# Patient Record
Sex: Male | Born: 1990
Health system: Southern US, Community
[De-identification: ages and names within clinical notes are randomized; demographics above are authoritative.]

## PROBLEM LIST (undated history)

## (undated) DIAGNOSIS — Z8489 Family history of other specified conditions: Secondary | ICD-10-CM

## (undated) DIAGNOSIS — Z87442 Personal history of urinary calculi: Secondary | ICD-10-CM

## (undated) DIAGNOSIS — J189 Pneumonia, unspecified organism: Secondary | ICD-10-CM

## (undated) DIAGNOSIS — N200 Calculus of kidney: Secondary | ICD-10-CM

## (undated) DIAGNOSIS — K219 Gastro-esophageal reflux disease without esophagitis: Secondary | ICD-10-CM

## (undated) HISTORY — DX: Calculus of kidney: N20.0

## (undated) HISTORY — PX: APPENDECTOMY: SHX54

---
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
## 2011-08-30 ENCOUNTER — Emergency Department (INDEPENDENT_AMBULATORY_CARE_PROVIDER_SITE_OTHER)
Admission: EM | Admit: 2011-08-30 | Discharge: 2011-08-30 | Disposition: A | Discharge status: Home / Self Care | Payer: Self-pay | Source: Home / Self Care | Attending: Family Medicine | Admitting: Family Medicine

## 2011-08-30 ENCOUNTER — Emergency Department (INDEPENDENT_AMBULATORY_CARE_PROVIDER_SITE_OTHER): Payer: Medicaid Other

## 2011-08-30 ENCOUNTER — Encounter: Payer: Self-pay | Admitting: Emergency Medicine

## 2011-08-30 DIAGNOSIS — J111 Influenza due to unidentified influenza virus with other respiratory manifestations: Secondary | ICD-10-CM

## 2011-08-30 LAB — POCT RAPID STREP A: Streptococcus, Group A Screen (Direct): NEGATIVE

## 2011-08-30 LAB — POCT URINALYSIS DIP (DEVICE)
Protein, ur: 30 mg/dL — AB
Specific Gravity, Urine: 1.025 (ref 1.005–1.030)
Urobilinogen, UA: 0.2 mg/dL (ref 0.0–1.0)
pH: 5 (ref 5.0–8.0)

## 2011-08-30 MED ORDER — ONDANSETRON HCL 4 MG PO TABS: 4.0000 mg | ORAL_TABLET | Freq: Four times a day (QID) | ORAL | Status: AC

## 2011-08-30 MED ORDER — ACETAMINOPHEN 325 MG PO TABS
ORAL_TABLET | ORAL | Status: AC
  Filled 2011-08-30: qty 1

## 2011-08-30 MED ORDER — ACETAMINOPHEN 325 MG PO TABS
650.0000 mg | ORAL_TABLET | Freq: Four times a day (QID) | ORAL | Status: DC | PRN
  Administered 2011-08-30: 650 mg via ORAL

## 2011-08-30 MED ORDER — ONDANSETRON 4 MG PO TBDP
ORAL_TABLET | ORAL | Status: AC
  Filled 2011-08-30: qty 1

## 2011-08-30 MED ORDER — ONDANSETRON 4 MG PO TBDP
4.0000 mg | ORAL_TABLET | Freq: Once | ORAL | Status: AC
  Administered 2011-08-30: 4 mg via ORAL

## 2011-08-30 NOTE — ED Provider Notes (Signed)
History     CSN: [id number] Arrival date & time: 08/30/2011 11:37 AM   First MD Initiated Contact with Patient 08/30/11 1058      Chief Complaint  Patient presents with  . Fever    fever and vomiting initially--2 days ago.  since then stomach pain, sore throat followed vomiting, low back pain and general aches, reporting fevers of 104 at home.      (Consider location/radiation/quality/duration/timing/severity/associated sxs/prior treatment) Patient is a 20 y.o. male presenting with fever. The history is provided by the patient.  Fever Primary symptoms of the febrile illness include fever, fatigue, cough, abdominal pain and vomiting. Primary symptoms do not include diarrhea, dysuria or rash. The current episode started 3 to 5 days ago. This is a new problem. The problem has not changed since onset.   History reviewed. No pertinent past medical history.  History reviewed. No pertinent past surgical history.  History reviewed. No pertinent family history.  History  Substance Use Topics  . Smoking status: Never Smoker   . Smokeless tobacco: Not on file  . Alcohol Use: No      Review of Systems  Constitutional: Positive for fever, activity change, appetite change and fatigue.  HENT: Positive for congestion, sore throat, rhinorrhea and postnasal drip.   Respiratory: Positive for cough.   Gastrointestinal: Positive for vomiting and abdominal pain. Negative for diarrhea.  Genitourinary: Negative for dysuria.  Musculoskeletal: Positive for back pain.  Skin: Negative for rash.    Allergies  Review of patient's allergies indicates no known allergies.  Home Medications   Current Outpatient Rx  Name Route Sig Dispense Refill  . ONDANSETRON HCL 4 MG PO TABS Oral Take 1 tablet (4 mg total) by mouth every 6 (six) hours. 6 tablet 0    BP 100/64  Pulse 115  Temp(Src) 103 F (39.4 C) (Oral)  Resp 18  SpO2 98%  Physical Exam  Nursing note and vitals  reviewed. Constitutional: He appears well-developed and well-nourished.  HENT:  Head: Normocephalic.  Right Ear: External ear normal.  Left Ear: External ear normal.  Mouth/Throat: Posterior oropharyngeal erythema present. No oropharyngeal exudate.  Cardiovascular: Normal rate, normal heart sounds and intact distal pulses.   Pulmonary/Chest: Effort normal and breath sounds normal.  Abdominal: Soft. Normal appearance and bowel sounds are normal. There is tenderness in the suprapubic area. There is rebound. There is no guarding and no CVA tenderness.  Skin: Skin is warm and dry.    ED Course  Procedures (including critical care time)  Labs Reviewed  POCT URINALYSIS DIP (DEVICE) - Abnormal; Notable for the following:    Bilirubin Urine SMALL (*)    Ketones, ur TRACE (*)    Protein, ur 30 (*)    All other components within normal limits  POCT RAPID STREP A ([facility] CARE ONLY)  POCT URINALYSIS DIPSTICK   Dg Chest 2 View  08/30/2011  *RADIOLOGY REPORT*  Clinical Data: Fever and cough  CHEST - 2 VIEW  Comparison: 10/12/2010  Findings: Cardiomediastinal silhouette is within normal limits. Lungs are clear.  No pneumothorax and no pleural effusion. Hyperaeration.  IMPRESSION: No active cardiopulmonary disease.  Original Report Authenticated By: Donavan Burnet, M.D.     1. Influenza       MDM  X-rays reviewed and report per radiologist.,  Strep neg and u/a neg.         Barkley Bruns, MD 08/30/11 1247

## 2011-08-30 NOTE — ED Notes (Signed)
See above

## 2015-04-03 ENCOUNTER — Emergency Department (HOSPITAL_BASED_OUTPATIENT_CLINIC_OR_DEPARTMENT_OTHER)
Admission: EM | Admit: 2015-04-03 | Discharge: 2015-04-03 | Disposition: A | Discharge status: Home / Self Care | Payer: Self-pay | Attending: Emergency Medicine | Admitting: Emergency Medicine

## 2015-04-03 ENCOUNTER — Emergency Department (HOSPITAL_BASED_OUTPATIENT_CLINIC_OR_DEPARTMENT_OTHER): Payer: Self-pay

## 2015-04-03 ENCOUNTER — Encounter (HOSPITAL_BASED_OUTPATIENT_CLINIC_OR_DEPARTMENT_OTHER): Payer: Self-pay

## 2015-04-03 DIAGNOSIS — Y9367 Activity, basketball: Secondary | ICD-10-CM | POA: Insufficient documentation

## 2015-04-03 DIAGNOSIS — Y92310 Basketball court as the place of occurrence of the external cause: Secondary | ICD-10-CM | POA: Insufficient documentation

## 2015-04-03 DIAGNOSIS — Y998 Other external cause status: Secondary | ICD-10-CM | POA: Insufficient documentation

## 2015-04-03 DIAGNOSIS — W51XXXA Accidental striking against or bumped into by another person, initial encounter: Secondary | ICD-10-CM | POA: Insufficient documentation

## 2015-04-03 DIAGNOSIS — S0083XA Contusion of other part of head, initial encounter: Secondary | ICD-10-CM | POA: Insufficient documentation

## 2015-04-03 DIAGNOSIS — S060X1A Concussion with loss of consciousness of 30 minutes or less, initial encounter: Secondary | ICD-10-CM | POA: Insufficient documentation

## 2015-04-03 NOTE — Discharge Instructions (Signed)
Tylenol 1000 mg rotated with Motrin 600 mg every hour hours as needed for headache.  Do not participate in contact sports until you are symptom-free for 1 week.  Return to the ER if symptoms significantly worsen or change.   Concussion A concussion, or closed-head injury, is a brain injury caused by a direct blow to the head or by a quick and sudden movement (jolt) of the head or neck. Concussions are usually not life-threatening. Even so, the effects of a concussion can be serious. If you have had a concussion before, you are more likely to experience concussion-like symptoms after a direct blow to the head.  CAUSES  Direct blow to the head, such as from running into another player during a soccer game, being hit in a fight, or hitting your head on a hard surface.  A jolt of the head or neck that causes the brain to move back and forth inside the skull, such as in a car crash. SIGNS AND SYMPTOMS The signs of a concussion can be hard to notice. Early on, they may be missed by you, family members, and health care providers. You may look fine but act or feel differently. Symptoms are usually temporary, but they may last for days, weeks, or even longer. Some symptoms may appear right away while others may not show up for hours or days. Every head injury is different. Symptoms include:  Mild to moderate headaches that will not go away.  A feeling of pressure inside your head.  Having more trouble than usual:  Learning or remembering things you have heard.  Answering questions.  Paying attention or concentrating.  Organizing daily tasks.  Making decisions and solving problems.  Slowness in thinking, acting or reacting, speaking, or reading.  Getting lost or being easily confused.  Feeling tired all the time or lacking energy (fatigued).  Feeling drowsy.  Sleep disturbances.  Sleeping more than usual.  Sleeping less than usual.  Trouble falling asleep.  Trouble sleeping  (insomnia).  Loss of balance or feeling lightheaded or dizzy.  Nausea or vomiting.  Numbness or tingling.  Increased sensitivity to:  Sounds.  Lights.  Distractions.  Vision problems or eyes that tire easily.  Diminished sense of taste or smell.  Ringing in the ears.  Mood changes such as feeling sad or anxious.  Becoming easily irritated or angry for little or no reason.  Lack of motivation.  Seeing or hearing things other people do not see or hear (hallucinations). DIAGNOSIS Your health care provider can usually diagnose a concussion based on a description of your injury and symptoms. He or she will ask whether you passed out (lost consciousness) and whether you are having trouble remembering events that happened right before and during your injury. Your evaluation might include:  A brain scan to look for signs of injury to the brain. Even if the test shows no injury, you may still have a concussion.  Blood tests to be sure other problems are not present. TREATMENT  Concussions are usually treated in an emergency department, in urgent care, or at a clinic. You may need to stay in the hospital overnight for further treatment.  Tell your health care provider if you are taking any medicines, including prescription medicines, over-the-counter medicines, and natural remedies. Some medicines, such as blood thinners (anticoagulants) and aspirin, may increase the chance of complications. Also tell your health care provider whether you have had alcohol or are taking illegal drugs. This information may affect treatment.  Your health care provider will send you home with important instructions to follow.  How fast you will recover from a concussion depends on many factors. These factors include how severe your concussion is, what part of your brain was injured, your age, and how healthy you were before the concussion.  Most people with mild injuries recover fully. Recovery can  take time. In general, recovery is slower in older persons. Also, persons who have had a concussion in the past or have other medical problems may find that it takes longer to recover from their current injury. HOME CARE INSTRUCTIONS General Instructions  Carefully follow the directions your health care provider gave you.  Only take over-the-counter or prescription medicines for pain, discomfort, or fever as directed by your health care provider.  Take only those medicines that your health care provider has approved.  Do not drink alcohol until your health care provider says you are well enough to do so. Alcohol and certain other drugs may slow your recovery and can put you at risk of further injury.  If it is harder than usual to remember things, write them down.  If you are easily distracted, try to do one thing at a time. For example, do not try to watch TV while fixing dinner.  Talk with family members or close friends when making important decisions.  Keep all follow-up appointments. Repeated evaluation of your symptoms is recommended for your recovery.  Watch your symptoms and tell others to do the same. Complications sometimes occur after a concussion. Older adults with a brain injury may have a higher risk of serious complications, such as a blood clot on the brain.  Tell your teachers, school nurse, school counselor, coach, athletic trainer, or work Freight forwarder about your injury, symptoms, and restrictions. Tell them about what you can or cannot do. They should watch for:  Increased problems with attention or concentration.  Increased difficulty remembering or learning new information.  Increased time needed to complete tasks or assignments.  Increased irritability or decreased ability to cope with stress.  Increased symptoms.  Rest. Rest helps the brain to heal. Make sure you:  Get plenty of sleep at night. Avoid staying up late at night.  Keep the same bedtime hours on  weekends and weekdays.  Rest during the day. Take daytime naps or rest breaks when you feel tired.  Limit activities that require a lot of thought or concentration. These include:  Doing homework or job-related work.  Watching TV.  Working on the computer.  Avoid any situation where there is potential for another head injury (football, hockey, soccer, basketball, martial arts, downhill snow sports and horseback riding). Your condition will get worse every time you experience a concussion. You should avoid these activities until you are evaluated by the appropriate follow-up health care providers. Returning To Your Regular Activities You will need to return to your normal activities slowly, not all at once. You must give your body and brain enough time for recovery.  Do not return to sports or other athletic activities until your health care provider tells you it is safe to do so.  Ask your health care provider when you can drive, ride a bicycle, or operate heavy machinery. Your ability to react may be slower after a brain injury. Never do these activities if you are dizzy.  Ask your health care provider about when you can return to work or school. Preventing Another Concussion It is very important to avoid another brain injury,  especially before you have recovered. In rare cases, another injury can lead to permanent brain damage, brain swelling, or death. The risk of this is greatest during the first 7-10 days after a head injury. Avoid injuries by:  Wearing a seat belt when riding in a car.  Drinking alcohol only in moderation.  Wearing a helmet when biking, skiing, skateboarding, skating, or doing similar activities.  Avoiding activities that could lead to a second concussion, such as contact or recreational sports, until your health care provider says it is okay.  Taking safety measures in your home.  Remove clutter and tripping hazards from floors and stairways.  Use grab bars  in bathrooms and handrails by stairs.  Place non-slip mats on floors and in bathtubs.  Improve lighting in dim areas. SEEK MEDICAL CARE IF:  You have increased problems paying attention or concentrating.  You have increased difficulty remembering or learning new information.  You need more time to complete tasks or assignments than before.  You have increased irritability or decreased ability to cope with stress.  You have more symptoms than before. Seek medical care if you have any of the following symptoms for more than 2 weeks after your injury:  Lasting (chronic) headaches.  Dizziness or balance problems.  Nausea.  Vision problems.  Increased sensitivity to noise or light.  Depression or mood swings.  Anxiety or irritability.  Memory problems.  Difficulty concentrating or paying attention.  Sleep problems.  Feeling tired all the time. SEEK IMMEDIATE MEDICAL CARE IF:  You have severe or worsening headaches. These may be a sign of a blood clot in the brain.  You have weakness (even if only in one hand, leg, or part of the face).  You have numbness.  You have decreased coordination.  You vomit repeatedly.  You have increased sleepiness.  One pupil is larger than the other.  You have convulsions.  You have slurred speech.  You have increased confusion. This may be a sign of a blood clot in the brain.  You have increased restlessness, agitation, or irritability.  You are unable to recognize people or places.  You have neck pain.  It is difficult to wake you up.  You have unusual behavior changes.  You lose consciousness. MAKE SURE YOU:  Understand these instructions.  Will watch your condition.  Will get help right away if you are not doing well or get worse. Document Released: 11/29/2003 Document Revised: 09/13/2013 Document Reviewed: 03/31/2013 [facility] Patient Information 2015 [place], [place]. This information is not intended to  replace advice given to you by your health care provider. Make sure you discuss any questions you have with your health care provider.

## 2015-04-03 NOTE — ED Notes (Signed)
Playing basketball last night-LOC when elbowed left eye area-c/o pain, dizzy, vomiting-bruising noted to lateral left eye area-NAD

## 2015-04-03 NOTE — ED Provider Notes (Signed)
CSN: [id number]     Arrival date & time 04/03/15  2120 History   This chart was scribed for Geoffery Lyonsouglas Kacey Dysert, MD by Abel PrestoKara Demonbreun, ED Scribe. This patient was seen in room MH07/MH07 and the patient's care was started at 10:02 PM.    Chief Complaint  Patient presents with  . Head Injury     The history is provided by the patient. No language interpreter was used.   HPI Comments: Peyton Najjarnthony W Sand is a 24 y.o. male with no significant PMHx who presents to the Emergency Department complaining of head injury around 8:30 PM last night. Pt states he was playing basketball and was elbowed near left eye at onset and fell onto back. Noted bruising. Pt notes associated LOC for several seconds, constant headache, weakness, mild blurring of left eye this morning which has resolved, and facial pain radiating into neck and back. He states weakness is worse with standing and reports he cannot stand for long. Pt denies any other complaints currently.   History reviewed. No pertinent past medical history. History reviewed. No pertinent past surgical history. No family history on file. History  Substance Use Topics  . Smoking status: Never Smoker   . Smokeless tobacco: Not on file  . Alcohol Use: No    Review of Systems A complete 10 system review of systems was obtained and all systems are negative except as noted in the HPI and PMH.     Allergies  Review of patient's allergies indicates no known allergies.  Home Medications   Prior to Admission medications   Not on File   BP 124/72 mmHg  Pulse 73  Temp(Src) 98.8 F (37.1 C) (Oral)  Resp 16  Ht 5\' 8"  (1.727 m)  Wt 128 lb (58.06 kg)  BMI 19.47 kg/m2  SpO2 100% Physical Exam  Constitutional: He is oriented to person, place, and time. He appears well-developed and well-nourished.  HENT:  Head: Normocephalic.  Eyes: EOM are normal. Pupils are equal, round, and reactive to light.  There is an ecchymotic area to the soft tissues lateral to the  left eye. The is no diplopia with upward gaze. No evidence of orbital muscle entrapment  Neck: Normal range of motion.  Pulmonary/Chest: Effort normal.  Abdominal: He exhibits no distension.  Musculoskeletal: Normal range of motion.  Neurological: He is alert and oriented to person, place, and time. No cranial nerve deficit. He exhibits normal muscle tone. Coordination normal.  Psychiatric: He has a normal mood and affect.  Nursing note and vitals reviewed.   ED Course  Procedures (including critical care time) DIAGNOSTIC STUDIES: Oxygen Saturation is 100% on room air, normal by my interpretation.    COORDINATION OF CARE: 10:06 PM Discussed treatment plan with patient at beside, the patient agrees with the plan and has no further questions at this time.   Marland Kitchen.scribeeed  Labs Review Labs Reviewed - No data to display  Imaging Review No results found.   EKG Interpretation None      MDM   Final diagnoses:  None    Patient is a [age] year old male who presents after being elbowed in the left eye while playing basketball. He reports a 1 minute loss of consciousness and swelling and bruising the next to his left eye. His neurologic exam is nonfocal and head CT is negative. I see no clinical evidence for blowout fracture and none appear on the CT scan. He will be discharged to home with instructions to return as needed. I have  also advised him not to return any contact sports until he is been symptom-free for at least one week.   I personally performed the services described in this documentation, which was scribed in my presence. The recorded information has been reviewed and is accurate.      Geoffery Lyons, MD 04/03/15 2248

## 2015-04-07 ENCOUNTER — Emergency Department (HOSPITAL_BASED_OUTPATIENT_CLINIC_OR_DEPARTMENT_OTHER)
Admission: EM | Admit: 2015-04-07 | Discharge: 2015-04-07 | Disposition: A | Discharge status: Home / Self Care | Payer: Self-pay | Attending: Emergency Medicine | Admitting: Emergency Medicine

## 2015-04-07 ENCOUNTER — Encounter (HOSPITAL_BASED_OUTPATIENT_CLINIC_OR_DEPARTMENT_OTHER): Payer: Self-pay | Admitting: *Deleted

## 2015-04-07 DIAGNOSIS — G44309 Post-traumatic headache, unspecified, not intractable: Secondary | ICD-10-CM | POA: Insufficient documentation

## 2015-04-07 DIAGNOSIS — F0781 Postconcussional syndrome: Secondary | ICD-10-CM | POA: Insufficient documentation

## 2015-04-07 MED ORDER — FLUORESCEIN SODIUM 1 MG OP STRP
1.0000 | ORAL_STRIP | Freq: Once | OPHTHALMIC | Status: AC
  Administered 2015-04-07: 1 via OPHTHALMIC
  Filled 2015-04-07: qty 1

## 2015-04-07 MED ORDER — CLINDAMYCIN PALMITATE HCL 75 MG/5ML PO SOLR
8.0000 mg/kg | Freq: Once | ORAL | Status: DC
  Filled 2015-04-07: qty 31

## 2015-04-07 MED ORDER — SODIUM CHLORIDE 0.9 % IV BOLUS (SEPSIS)
1000.0000 mL | Freq: Once | INTRAVENOUS | Status: AC
  Administered 2015-04-07: 1000 mL via INTRAVENOUS

## 2015-04-07 MED ORDER — TETRACAINE HCL 0.5 % OP SOLN
2.0000 [drp] | Freq: Once | OPHTHALMIC | Status: AC
  Administered 2015-04-07: 2 [drp] via OPHTHALMIC

## 2015-04-07 NOTE — ED Notes (Addendum)
Elbowed in eye Monday night, mentions +LOC, seen here Tuesday, dx'd with concussion, sx continue, pt lists: HA, dizzy, legs give out, episodes of can't breathe at night, light sensitive, have to leave work. Pt alert, NAD, calm, interactive, speech clear, steady gait. Urine sample saved in triage.

## 2015-04-07 NOTE — ED Provider Notes (Signed)
CSN: [id number]     Arrival date & time 04/07/15  1859 History   First MD Initiated Contact with Patient 04/07/15 1915     Chief Complaint  Patient presents with  . Headache     (Consider location/radiation/quality/duration/timing/severity/associated sxs/prior Treatment) HPI   Blood pressure 119/75, pulse 54, temperature 98.1 F (36.7 C), temperature source Oral, resp. rate 18, height 5\' 8"  (1.727 m), weight 128 lb (58.06 kg), SpO2 100 %.  Kevin Benton is a 24 y.o. male who was elbowed in the left eye while playing basketball several days ago. Patient fell backwards hitting the occipital part of his head on the ground, there was a brief loss of consciousness. Patient was seen and evaluated at the time of the incident, his head CT at that time was negative. There is no evidence of blowout fracture ocular entrapment. Patient states that he continues to have symptoms of lightheaded sensation, states that he feels weak and like his legs are buckling, he also states that he feels like he is leaning to the right when he is walking however he has not fallen. States he has difficulty concentrating and wakes up in the middle of the night feeling like it's difficult for him to breathe. Patient denies headache, change in vision, vomiting, dysarthria, ataxia, chest pain, shortness of breath.  History reviewed. No pertinent past medical history. History reviewed. No pertinent past surgical history. No family history on file. History  Substance Use Topics  . Smoking status: Never Smoker   . Smokeless tobacco: Not on file  . Alcohol Use: No    Review of Systems  10 systems reviewed and found to be negative, except as noted in the HPI.   Allergies  Review of patient's allergies indicates no known allergies.  Home Medications   Prior to Admission medications   Not on File   BP 119/75 mmHg  Pulse 54  Temp(Src) 98.1 F (36.7 C) (Oral)  Resp 18  Ht 5\' 8"  (1.727 m)  Wt 128 lb (58.06 kg)   BMI 19.47 kg/m2  SpO2 100% Physical Exam  Constitutional: He is oriented to person, place, and time. He appears well-developed and well-nourished. No distress.  HENT:  Head: Normocephalic.    Mouth/Throat: Oropharynx is clear and moist.  Extraocular movement intact without pain or diplopia. No abnormal uptake on fluorescein stain.  Visual Acuity - Bilateral Near: 20/20 ; R Near: 20/20 ; L Near: 20/25  Eyes: Conjunctivae and EOM are normal. Pupils are equal, round, and reactive to light.  Cardiovascular: Normal rate, regular rhythm and intact distal pulses.   Pulmonary/Chest: Effort normal. No stridor.  Musculoskeletal: Normal range of motion.  Neurological: He is alert and oriented to person, place, and time.  Follows commands, Clear, goal oriented speech, Strength is 5 out of 5x4 extremities, patient ambulates with a coordinated in nonantalgic gait. Sensation is grossly intact.   Psychiatric: He has a normal mood and affect.  Nursing note and vitals reviewed.   ED Course  Procedures (including critical care time) Labs Review Labs Reviewed - No data to display  Imaging Review No results found.   EKG Interpretation None      MDM   Final diagnoses:  Post concussion syndrome    Filed Vitals:   04/07/15 1903  BP: 119/75  Pulse: 54  Temp: 98.1 F (36.7 C)  TempSrc: Oral  Resp: 18  Height: 5\' 8"  (1.727 m)  Weight: 128 lb (58.06 kg)  SpO2: 100%    Medications  tetracaine (PONTOCAINE) 0.5 % ophthalmic solution 2 drop (not administered)  fluorescein ophthalmic strip 1 strip (not administered)  sodium chloride 0.9 % bolus 1,000 mL (0 mLs Intravenous Stopped 04/07/15 2008)    Kevin Benton is a pleasant 24 y.o. male presenting with a constellation of symptoms consistent with a postconcussive syndrome. Patient had a mild head trauma with fleeting LOC several days ago. He had a negative head CT at that time. Neuro exam today is nonfocal. Normal visual acuity and  fluorescein stain of the eye is also normal. Patient is counseled to avoid any activity that might result in secondary head impact. I'll advise him that he will need to follow with neurology or establish primary care for management. We've had a discussion of what to expect with postconcussive syndrome and patient verbalizes his understanding. Work note as Barrister's clerk.  Evaluation does not show pathology that would require ongoing emergent intervention or inpatient treatment. Pt is hemodynamically stable and mentating appropriately. Discussed findings and plan with patient/guardian, who agrees with care plan. All questions answered. Return precautions discussed and outpatient follow up given.        Joni Reining Hadi Dubin, PA-C 04/07/15 2536  Rolan Bucco, MD 04/08/15 [phone]

## 2015-04-07 NOTE — Discharge Instructions (Signed)
Do not participate in any sports or any activities that could result in head trauma until you are cleared by your pediatrician,  primary care physician or neurologist.   Do not hesitate to return to the emergency room for any new, worsening or concerning symptoms.  Please obtain primary care using resource guide below. Let them know that you were seen in the emergency room and that they will need to obtain records for further outpatient management.    Post-Concussion Syndrome Post-concussion syndrome means you have problems after a head injury. The problems can last for weeks or months. The problems usually go away on their own over time. HOME CARE   Only take medicines as told by your doctor. Do not take aspirin.  Sleep with your head raised (elevated) to help with headaches.  Avoid activities that can cause another head injury.  Do not play contact sports like football, hockey, soccer or basketball. Do not do other risky activities like downhill skiing or martial arts, or ride horses until your doctor says it is OK.  Keep all doctor visits as told. GET HELP RIGHT AWAY IF:  You feel confused or very sleepy.  You cannot wake the injured person.  You feel sick to your stomach (nauseous) or keep throwing up (vomiting).  You feel like you are moving when you are not (vertigo).  You notice the injured person's eyes moving back and forth very fast.  You start shaking (convulsing) or pass out (faint).  You have very bad headaches that do not get better with medicine.  You cannot use your arms or legs normally.  The black centers of your eyes (pupils) change size.  You have clear or bloody fluid coming from your nose or ears.  Your problems get worse, not better. MAKE SURE YOU:  Understand these instructions.  Will watch your condition.  Will get help right away if you are not doing well or get worse. Document Released: 10/16/2004 Document Revised: 06/29/2013 Document  Reviewed: 12/14/2013 [facility] Patient Information 2015 [employer] [place]. This information is not intended to replace advice given to you by your health care provider. Make sure you discuss any questions you have with your health care provider.  Emergency Department Resource Guide 1) Find a Doctor and Pay Out of Pocket Although you won't have to find out who is covered by your insurance plan, it is a good idea to ask around and get recommendations. You will then need to call the office and see if the doctor you have chosen will accept you as a new patient and what types of options they offer for patients who are self-pay. Some doctors offer discounts or will set up payment plans for their patients who do not have insurance, but you will need to ask so you aren't surprised when you get to your appointment.  2) Contact Your Local Health Department Not all health departments have doctors that can see patients for sick visits, but many do, so it is worth a call to see if yours does. If you don't know where your local health department is, you can check in your phone book. The CDC also has a tool to help you locate your state's health department, and many state websites also have listings of all of their local health departments.  3) Find a Walk-in Clinic If your illness is not likely to be very severe or complicated, you may want to try a walk in clinic. These are popping up all over the country  in pharmacies, drugstores, and shopping centers. They're usually staffed by nurse practitioners or physician assistants that have been trained to treat common illnesses and complaints. They're usually fairly quick and inexpensive. However, if you have serious medical issues or chronic medical problems, these are probably not your best option.  No Primary Care Doctor: - Call Health Connect at  [phone] - they can help you locate a primary care doctor that  accepts your insurance, provides certain services,  etc. - Physician Referral Service- [phone]  Chronic Pain Problems: Organization         Address  Phone   Notes  [facility]  [phone] Patients need to be referred by their primary care doctor.   Medication Assistance: Organization         Address  Phone   Notes  [facility] [address] [place] [place], [place] [address] [phone] --Must be a resident of [facility] -- Must have NO insurance coverage whatsoever (no Medicaid/ Medicare, etc.) -- The pt. MUST have a primary care doctor that directs their care regularly and follows them in the community   [facility]  [phone]   [employer]  [phone]    Agencies that provide inexpensive medical care: Organization         Address  Phone   Notes  [facility]  [phone]   [facility]    [phone]   [facility] [address] [place], [place] [address] [phone]   [facility] [place] [facility] [place] [address] [place], [place] [phone]   [facility]anned Parenthood    [phone]   [facility]    [phone]   [facility]  [address], [place] Phone:  [phone], Fax:  [phone] Hours of Operation:  9 am - 6 pm, M-F.  Also accepts Medicaid/Medicare and self-pay.  [facility]  [address], [place] Phone: [phone], Fax: [phone]. Hours of Operation:  8:30 am - 5:30 pm, M-F.  Also accepts Medicaid and self-pay.  [facility] [place] [address], [place] Phone: [phone]   [facility] [address] Natasha Bence [place], [place] [phone], Ext. [phone] Mondays & Thursdays: 7-9 AM.  First 15 patients are seen on a first come, first serve basis.    Medicaid-accepting [facility] Providers:  Organization         Address  Phone   Notes  [facility] [address], [place] [phone] Also accepts self-pay patients.  [facility] [address] Laurell Josephs [place], [place]  [phone]   [facility] [address], [place] [phone]   [facility] [address], [place] [phone]   Renaye Rakers [address], [place]   [phone] Only accepts [place] Access [place] patients after they have their name applied to their card.   Self-Pay (no insurance) in [facility]:  Organization         Address  Phone   Notes  Sickle Cell Patients, [facility] [address] [place], [place] [phone]   [facility] [address] [place], [place] [phone]   [facility]  [address], [place] [phone]   [facility]/Dr. Osei-Bonsu  [address] [place] or [address], [place] [phone] Phone number for both [place] and [place] locations is the same.  [facility] [address] [place] [phone]   [facility] [address], [place] or [address] [phone]   [facility] [address], [place] [phone], phone; [phone], fax Sees patients 1st and 3rd Saturday of every month.  Must not qualify for public or private insurance (i.e. Medicaid, Medicare, [place] Health Choice, Veterans' Benefits)  Household income should be no more than 200% of the poverty level The clinic cannot treat you if you are pregnant or think you are pregnant  Sexually transmitted diseases are not treated at the clinic.    Dental Care: Organization         Address  Phone  Notes  [facility] Department of [facility] [address] [place], [place] [phone] Accepts children up to  age 29 who are enrolled in [place] or [place] Health Choice; pregnant women with a Medicaid card; and children who have applied for Medicaid or [place] Health Choice, but were declined, whose parents can pay a reduced fee at time of service.  [facility] of [facility]  [address], [place] [phone] Accepts children up to age 44 who are enrolled in [place] or [place] Health Choice; pregnant women with a Medicaid card; and children who have applied for Medicaid or [place] Health Choice, but were declined, whose parents can pay a reduced fee at time of service.  [facility]  [address] [place], [place] [phone] Patients are seen by appointment only. Walk-ins are not accepted. [facility] will see patients 7 years of age and older. Monday - Tuesday (8am-5pm) Most Wednesdays (8:30-5pm) $30 per visit, cash only  [facility]  [address], [facility] [phone] Patients are seen by appointment only. Walk-ins are not accepted. [facility] will see patients 22 years of age and older. One Wednesday Evening (Monthly: Volunteer Based).  $30 per visit, cash only  [employer]  [phone] for adults; Children under age 72, call Graduate Pediatric Dentistry at [phone]. Children aged 59-14, please call [phone] to request a pediatric application.  Dental services are provided in all areas of dental care including fillings, crowns and bridges, complete and partial dentures, implants, gum treatment, root canals, and extractions. Preventive care is also provided. Treatment is provided to both adults and children. Patients are selected via a lottery and there is often a waiting list.   [facility] [address] [place]  [phone] [email or web address]   [facility] [address] [place], [place] [phone], Ext. [phone] Second and Fourth Thursday of  each month, opens at 6:30 AM; Clinic ends at 9 AM.  Patients are seen on a first-come first-served basis, and a limited number are seen during each clinic.   [facility]  [address] Ether Griffins [place], [place] [phone]   Eligibility Requirements You must have lived in [place], [place], or [place] counties for at least the last three months.   You cannot be eligible for state or federal sponsored [place], including [employer], [place], or [employer].   You generally  cannot be eligible for healthcare insurance through your employer.    How to apply: Eligibility screenings are held every Tuesday and Wednesday afternoon from 1:00 pm until 4:00 pm. You do not need an appointment for the interview!  [facility] [address], [place], [place] [phone]   [facility] Health Department  [phone]   [facility] Health Department  [phone]   [facility] Health Department  [phone]    Behavioral Health Resources in the Community: Intensive Outpatient Programs Organization         Address  Phone  Notes  [facility] Services [address], [place], [place] [phone]   [facility] [address] [place], [place] [phone]   [facility] [address] [place], [place]  [phone]   [facility] Mental Health [address]  [place], [place] [phone] or [phone]   Substance Abuse Resources Organization         Address  Phone  Notes  [facility] Drug Services  [phone]   [facility]  [phone]   [facility] [place]  [phone]   Floydene Flock  [phone]   Residential & Outpatient Substance Abuse Program  [phone]   Psychological Services Organization         Address  Phone  Notes  [facility]  [phone]   [facility]  [phone]   [facility] [address],  [place] [phone] or [phone]    Mobile Crisis Teams Organization         Address  Phone  Notes  [facility], Mobile Crisis Care Unit  [phone]   [facility]  [address] [place], [place] [phone]   Doristine Locks [address] [place] [phone]    Self-Help/Support Groups Organization         Address  Phone             Notes  [facility] [place] - variety of support groups  336- I7437963 Call for more information  Narcotics Anonymous (NA), [facility] [address], [employer] [place]  2 meetings at this location   Statistician         Address  Phone  Notes  [facility] [address],    [place]  [phone]   [facility]  [address] [place] [address], [place], [place] [phone]   [facility] [address] [place], [place] [phone] Admissions: 8am-3pm M-F  [facility] [address]    [place], [place] [phone]   [facility] [address] [place], [place], [place] [phone]   [facility] [address]  [place], [place] [phone]   [facility] - Intensive Outpatient [address] Laurell Josephs [address], [place], [place] [phone]   [facility] [address] [place]  [place], [place] [phone] or [phone]   [facility] ([facility]) [address], [place], [place] [phone] Accepts Medicaid  [facility] [address]  [place] [phone] Substance Abuse/Addiction Treatment   [facility] Organization         Address  Phone  Notes  [facility]  [phone]   Angie Fava, PhD [address] Skeeters [place], [place]   [phone] or 3134119701   [facility]   [address]  [address] [place], [place] [phone]     [facility] [address] [place], [place] [phone] Insurance/Medicaid/sponsorship through [facility] and Families [address]                                    [place], [place] [phone] Therapy/tele-psych/case  [facility] [address]   [place], [place] [phone]    Dr. Lolly Mustache  [phone]   [facility]  [employer] [facility] Dept. 1) [address] [place] 2) [address], [place] 3)  [address], [place] [phone]  [phone]   [facility] Child Abuse Hotline 212-629-1137 or [phone] (After Hours)

## 2015-07-23 ENCOUNTER — Encounter (HOSPITAL_BASED_OUTPATIENT_CLINIC_OR_DEPARTMENT_OTHER): Payer: Self-pay | Admitting: *Deleted

## 2015-07-23 ENCOUNTER — Emergency Department (HOSPITAL_BASED_OUTPATIENT_CLINIC_OR_DEPARTMENT_OTHER): Payer: Self-pay

## 2015-07-23 ENCOUNTER — Emergency Department (HOSPITAL_BASED_OUTPATIENT_CLINIC_OR_DEPARTMENT_OTHER)
Admission: EM | Admit: 2015-07-23 | Discharge: 2015-07-23 | Disposition: A | Discharge status: Home / Self Care | Payer: Self-pay | Attending: Emergency Medicine | Admitting: Emergency Medicine

## 2015-07-23 DIAGNOSIS — S93501A Unspecified sprain of right great toe, initial encounter: Secondary | ICD-10-CM | POA: Insufficient documentation

## 2015-07-23 DIAGNOSIS — Y92321 Football field as the place of occurrence of the external cause: Secondary | ICD-10-CM | POA: Insufficient documentation

## 2015-07-23 DIAGNOSIS — Y9361 Activity, american tackle football: Secondary | ICD-10-CM | POA: Insufficient documentation

## 2015-07-23 DIAGNOSIS — S90111A Contusion of right great toe without damage to nail, initial encounter: Secondary | ICD-10-CM | POA: Insufficient documentation

## 2015-07-23 DIAGNOSIS — Y998 Other external cause status: Secondary | ICD-10-CM | POA: Insufficient documentation

## 2015-07-23 DIAGNOSIS — W228XXA Striking against or struck by other objects, initial encounter: Secondary | ICD-10-CM | POA: Insufficient documentation

## 2015-07-23 DIAGNOSIS — S90121A Contusion of right lesser toe(s) without damage to nail, initial encounter: Secondary | ICD-10-CM | POA: Insufficient documentation

## 2015-07-23 DIAGNOSIS — S93509A Unspecified sprain of unspecified toe(s), initial encounter: Secondary | ICD-10-CM

## 2015-07-23 NOTE — ED Notes (Signed)
Pt reports he was playing football on Saturday and right foot twisted in hole- pain and bruising noted- pt has been ambulating on foot with pain since injury

## 2015-07-23 NOTE — Discharge Instructions (Signed)

## 2015-07-23 NOTE — ED Provider Notes (Signed)
CSN: [id number]     Arrival date & time 07/23/15  1913 History   First MD Initiated Contact with Patient 07/23/15 2037     Chief Complaint  Patient presents with  . Foot Injury    The history is provided by the patient. No language interpreter was used.   Mr. Valentina LucksStout is a 24 year old man with no significant past medical history that presents for evaluation of foot injury. 2 days ago he was playing football and his right foot got stuck in a hole. His right great toe stayed still while the rest of his foot continued to move. He experienced immediate pain and he's been having pain with ambulation since that time. It is throbbing pain is worse with walking and standing. Symptoms are moderate, constant, worsening.  History reviewed. No pertinent past medical history. History reviewed. No pertinent past surgical history. No family history on file. Social History  Substance Use Topics  . Smoking status: Never Smoker   . Smokeless tobacco: Never Used  . Alcohol Use: No    Review of Systems  All other systems reviewed and are negative.     Allergies  Review of patient's allergies indicates no known allergies.  Home Medications   Prior to Admission medications   Not on File   BP 141/76 mmHg  Pulse 84  Temp(Src) 98.5 F (36.9 C) (Oral)  Resp 16  Ht 5\' 8"  (1.727 m)  Wt 128 lb (58.06 kg)  BMI 19.47 kg/m2  SpO2 100% Physical Exam  Constitutional: He is oriented to person, place, and time. He appears well-developed and well-nourished.  HENT:  Head: Normocephalic and atraumatic.  Cardiovascular: Normal rate.   Pulmonary/Chest: Effort normal. No respiratory distress.  Musculoskeletal:  2+ DP pulses in bilateral lower extremities. Right lower extremity without any significant swelling but there is ecchymosis over the great toe, lateral aspect of the foot and the second and third toes. There is tenderness to palpation over the great toe. No significant mid foot tenderness.   Neurological: He is alert and oriented to person, place, and time.  Skin: Skin is warm and dry.  Psychiatric: He has a normal mood and affect. His behavior is normal.  Nursing note and vitals reviewed.   ED Course  Procedures (including critical care time) Labs Review Labs Reviewed - No data to display  Imaging Review Dg Foot Complete Right  07/23/2015  CLINICAL DATA:  Twisting injury of right foot 2 days ago well playing football. Pain of multiple toes. Initial encounter. EXAM: RIGHT FOOT COMPLETE - 3+ VIEW COMPARISON:  None. FINDINGS: There is no evidence of acute fracture or dislocation. There is no evidence of arthropathy or other focal bone abnormality. Soft tissues are unremarkable. IMPRESSION: No evidence of fracture. Electronically Signed   By: Irish LackGlenn  Yamagata M.D.   On: 07/23/2015 19:47   I have personally reviewed and evaluated these images and lab results as part of my medical decision-making.   EKG Interpretation None      MDM   Final diagnoses:  Toe sprain, initial encounter    Patient here for evaluation of foot injury 2 days ago. He is well perfused and examination. Clinical picture is not consistent with Lisfranc injury. Discussed sprain with postop shoe and outpatient follow-up. Recommend ibuprofen at home, elevation. Reached her crutches were discussed.  Tilden FossaElizabeth Jaidynn Balster, MD 07/23/15 2337

## 2016-11-03 ENCOUNTER — Encounter (HOSPITAL_COMMUNITY): Payer: Self-pay | Admitting: Emergency Medicine

## 2016-11-03 ENCOUNTER — Emergency Department (HOSPITAL_COMMUNITY)
Admission: EM | Admit: 2016-11-03 | Discharge: 2016-11-03 | Disposition: A | Discharge status: Home / Self Care | Payer: Self-pay | Attending: Emergency Medicine | Admitting: Emergency Medicine

## 2016-11-03 ENCOUNTER — Ambulatory Visit (HOSPITAL_COMMUNITY)
Admission: EM | Admit: 2016-11-03 | Discharge: 2016-11-03 | Disposition: A | Discharge status: Nursing Home (Includes ICF) | Payer: Self-pay | Attending: Family Medicine | Admitting: Family Medicine

## 2016-11-03 ENCOUNTER — Emergency Department (HOSPITAL_COMMUNITY): Payer: Self-pay

## 2016-11-03 DIAGNOSIS — B349 Viral infection, unspecified: Secondary | ICD-10-CM | POA: Insufficient documentation

## 2016-11-03 DIAGNOSIS — R1013 Epigastric pain: Secondary | ICD-10-CM

## 2016-11-03 DIAGNOSIS — E860 Dehydration: Secondary | ICD-10-CM

## 2016-11-03 LAB — POCT URINALYSIS DIP (DEVICE)
BILIRUBIN URINE: NEGATIVE
Glucose, UA: NEGATIVE mg/dL
HGB URINE DIPSTICK: NEGATIVE
KETONES UR: NEGATIVE mg/dL
Leukocytes, UA: NEGATIVE
Nitrite: NEGATIVE
PH: 7 (ref 5.0–8.0)
PROTEIN: NEGATIVE mg/dL
Specific Gravity, Urine: 1.02 (ref 1.005–1.030)
Urobilinogen, UA: 0.2 mg/dL (ref 0.0–1.0)

## 2016-11-03 LAB — URINALYSIS, ROUTINE W REFLEX MICROSCOPIC
BILIRUBIN URINE: NEGATIVE
Glucose, UA: NEGATIVE mg/dL
Hgb urine dipstick: NEGATIVE
Ketones, ur: NEGATIVE mg/dL
Leukocytes, UA: NEGATIVE
NITRITE: NEGATIVE
PROTEIN: NEGATIVE mg/dL
Specific Gravity, Urine: 1.015 (ref 1.005–1.030)
pH: 5 (ref 5.0–8.0)

## 2016-11-03 LAB — POCT I-STAT, CHEM 8
BUN: 7 mg/dL (ref 6–20)
CHLORIDE: 103 mmol/L (ref 101–111)
Calcium, Ion: 1.18 mmol/L (ref 1.15–1.40)
Creatinine, Ser: 1.1 mg/dL (ref 0.61–1.24)
Glucose, Bld: 102 mg/dL — ABNORMAL HIGH (ref 65–99)
HCT: 38 % — ABNORMAL LOW (ref 39.0–52.0)
Hemoglobin: 12.9 g/dL — ABNORMAL LOW (ref 13.0–17.0)
POTASSIUM: 4.4 mmol/L (ref 3.5–5.1)
SODIUM: 138 mmol/L (ref 135–145)
TCO2: 31 mmol/L (ref 0–100)

## 2016-11-03 MED ORDER — ONDANSETRON HCL 4 MG PO TABS: 4.0000 mg | ORAL_TABLET | Freq: Four times a day (QID) | ORAL | 0 refills | Status: DC

## 2016-11-03 MED ORDER — ACETAMINOPHEN 325 MG PO TABS
650.0000 mg | ORAL_TABLET | Freq: Once | ORAL | Status: AC
  Administered 2016-11-03: 650 mg via ORAL

## 2016-11-03 MED ORDER — GI COCKTAIL ~~LOC~~
ORAL | Status: AC
  Filled 2016-11-03: qty 30

## 2016-11-03 MED ORDER — SODIUM CHLORIDE 0.9 % IV BOLUS (SEPSIS)
1000.0000 mL | Freq: Once | INTRAVENOUS | Status: AC
  Administered 2016-11-03: 1000 mL via INTRAVENOUS

## 2016-11-03 MED ORDER — GI COCKTAIL ~~LOC~~
30.0000 mL | Freq: Once | ORAL | Status: AC
  Administered 2016-11-03: 30 mL via ORAL

## 2016-11-03 MED ORDER — ONDANSETRON HCL 4 MG/2ML IJ SOLN
4.0000 mg | Freq: Once | INTRAMUSCULAR | Status: AC
  Administered 2016-11-03: 4 mg via INTRAVENOUS

## 2016-11-03 MED ORDER — ONDANSETRON HCL 4 MG/2ML IJ SOLN
INTRAMUSCULAR | Status: AC
  Filled 2016-11-03: qty 2

## 2016-11-03 MED ORDER — ACETAMINOPHEN 325 MG PO TABS
ORAL_TABLET | ORAL | Status: AC
  Filled 2016-11-03: qty 2

## 2016-11-03 NOTE — Discharge Instructions (Signed)
You have had 1 liter of IV fluid, IV Zofran, a GI cocktail of Maalox, Lidocaine, and Donnatal. Your iStat reading was mostly unremarkable with slight decrease in Hgb and Hct. Since your symptoms have not improved, I recommend you go to the emergency room for further treatment and evaluation.

## 2016-11-03 NOTE — ED Notes (Signed)
ivf infusion completed, notified lawrence, np of completed bolus.

## 2016-11-03 NOTE — ED Provider Notes (Signed)
[facility]-EMERGENCY DEPT Provider Note   CSN: [id number] Arrival date & time: 11/03/16  1514  History   Chief Complaint Chief Complaint  Patient presents with  . Influenza    HPI Kevin Benton is a 26 y.o. male.  HPI  26 y.o. male, presents to the Emergency Department today complaining of URI symptoms x 5 days. Fever with Tmax 105F at home. Notes N/V intermittently. No diarrhea. No sick contacts. Notes dysuria as well with bilateral flank pain. States he has generalized aches as well. Nyquil and Dayquil with minimal relief. Sent from UC due to not improving with IV fluids and Zofran. No other symptoms noted.   History reviewed. No pertinent past medical history.  There are no active problems to display for this patient.  History reviewed. No pertinent surgical history.   Home Medications    Prior to Admission medications   Not on File    Family History History reviewed. No pertinent family history.  Social History Social History  Substance Use Topics  . Smoking status: Never Smoker  . Smokeless tobacco: Never Used  . Alcohol use No     Allergies   Patient has no known allergies.   Review of Systems Review of Systems ROS reviewed and all are negative for acute change except as noted in the HPI.  Physical Exam Updated Vital Signs BP 109/68 (BP Location: Right Arm)   Pulse 75   Temp 99 F (37.2 C) (Oral)   Resp 18   SpO2 100%   Physical Exam  Constitutional: He is oriented to person, place, and time. He appears well-developed and well-nourished. No distress.  HENT:  Head: Normocephalic and atraumatic.  Right Ear: Tympanic membrane, external ear and ear canal normal.  Left Ear: Tympanic membrane, external ear and ear canal normal.  Nose: Nose normal.  Mouth/Throat: Uvula is midline, oropharynx is clear and moist and mucous membranes are normal. No trismus in the jaw. No oropharyngeal exudate, posterior oropharyngeal erythema or tonsillar abscesses.  Eyes:  EOM are normal. Pupils are equal, round, and reactive to light.  Neck: Normal range of motion. Neck supple. No tracheal deviation present.  Cardiovascular: Normal rate, regular rhythm, S1 normal, S2 normal, normal heart sounds, intact distal pulses and normal pulses.   Pulmonary/Chest: Effort normal and breath sounds normal. No respiratory distress. He has no decreased breath sounds. He has no wheezes. He has no rhonchi. He has no rales.  Abdominal: Normal appearance and bowel sounds are normal. There is no tenderness.  Musculoskeletal: Normal range of motion.  Neurological: He is alert and oriented to person, place, and time.  Skin: Skin is warm and dry.  Psychiatric: He has a normal mood and affect. His speech is normal and behavior is normal. Thought content normal.   ED Treatments / Results  Labs (all labs ordered are listed, but only abnormal results are displayed) Labs Reviewed  URINALYSIS, ROUTINE W REFLEX MICROSCOPIC    EKG  EKG Interpretation None       Radiology Dg Chest 2 View  Result Date: 11/03/2016 CLINICAL DATA:  [facility] body aches and some N/V today; pt was not improved with fluids and meds so sent down here EXAM: CHEST - 2 VIEW COMPARISON:  05/31/2014 FINDINGS: Lungs are clear. Heart size and mediastinal contours are within normal limits. No effusion. Visualized bones unremarkable. IMPRESSION: No acute cardiopulmonary disease. Electronically Signed   By: Corlis Leak  Hassell M.D.   On: 11/03/2016 18:38  Procedures Procedures (including critical care time)  Medications Ordered in ED Medications - No data to display   Initial Impression / Assessment and Plan / ED Course  I have reviewed the triage vital signs and the nursing notes.  Pertinent labs & imaging results that were available during my care of the patient were reviewed by me and considered in my medical decision making (see chart for details).  Final Clinical Impressions(s) /  ED Diagnoses  {I have reviewed and evaluated the relevant laboratory values. {I have reviewed and evaluated the relevant imaging studies.  {I have reviewed the relevant previous healthcare records.  {I obtained HPI from historian.   ED Course:  Assessment: Patient with symptoms consistent with influenza.  Vitals are stable, low-grade fever.  No signs of dehydration, tolerating PO's.  Lungs are clear. CXR unremarkable. UA unremarkable.  Discussed the cost versus benefit of Tamiflu treatment with the patient.  The patient understands that symptoms are greater than the recommended 24-48 hour window of treatment.  Patient will be discharged with instructions to orally hydrate, rest, and use over-the-counter medications such as anti-inflammatories ibuprofen and Aleve for muscle aches and Tylenol for fever.  Patient will also be given a cough suppressant.   Disposition/Plan:  DC Home Additional Verbal discharge instructions given and discussed with patient.  Pt Instructed to f/u with PCP in the next week for evaluation and treatment of symptoms. Return precautions given Pt acknowledges and agrees with plan  Supervising Physician Gwyneth Sprout, MD  Final diagnoses:  Viral syndrome    New Prescriptions New Prescriptions   No medications on file     Audry Pili, PA-C 11/03/16 1932    Gwyneth Sprout, MD 11/04/16 [phone]

## 2016-11-03 NOTE — ED Notes (Signed)
Patient Alert and oriented X4. Stable and ambulatory. Patient verbalized understanding of the discharge instructions.  Patient belongings were taken by the patient.  

## 2016-11-03 NOTE — ED Triage Notes (Signed)
Pt woke up five days ago with dizziness, SOB, fever and vomiting. He also reports "kidney pain" and back pain. The kidney pain has been intermittent for 5-6 months and he reports spitting up blood periodically with the kidney pain.

## 2016-11-03 NOTE — ED Triage Notes (Signed)
[facility], body [facility] and some N/V; pt was not improved with fluids and meds so sent down here

## 2016-11-03 NOTE — Discharge Instructions (Signed)
Please read and follow all provided instructions.  Your diagnoses today include:  1. Viral syndrome     You appear to have an upper respiratory infection (URI). An upper respiratory tract infection, or cold, is a viral infection of the air passages leading to the lungs. It should improve gradually after 5-7 days. You may have a lingering cough that lasts for 2- 4 weeks after the infection.  Tests performed today include: Vital signs. See below for your results today.   Medications prescribed:   Take any prescribed medications only as directed. Treatment for your infection is aimed at treating the symptoms. There are no medications, such as antibiotics, that will cure your infection.   Home care instructions:  Follow any educational materials contained in this packet.   Your illness is contagious and can be spread to others, especially during the first 3 or 4 days. It cannot be cured by antibiotics or other medicines. Take basic precautions such as washing your hands often, covering your mouth when you cough or sneeze, and avoiding public places where you could spread your illness to others.   Please continue drinking plenty of fluids.  Use over-the-counter medicines as needed as directed on packaging for symptom relief.  You may also use ibuprofen or tylenol as directed on packaging for pain or fever.  Do not take multiple medicines containing Tylenol or acetaminophen to avoid taking too much of this medication.  Follow-up instructions: Please follow-up with your primary care provider in the next 3 days for further evaluation of your symptoms if you are not feeling better.   Return instructions:  Please return to the Emergency Department if you experience worsening symptoms.  RETURN IMMEDIATELY IF you develop shortness of breath, confusion or altered mental status, a new rash, become dizzy, faint, or poorly responsive, or are unable to be cared for at home. Please return if you have  persistent vomiting and cannot keep down fluids or develop a fever that is not controlled by tylenol or motrin.   Please return if you have any other emergent concerns.  Additional Information:  Your vital signs today were: BP 109/68 (BP Location: Right Arm)    Pulse 75    Temp 99 F (37.2 C) (Oral)    Resp 18    SpO2 100%  If your blood pressure (BP) was elevated above 135/85 this visit, please have this repeated by your doctor within one month. --------------

## 2016-11-03 NOTE — ED Provider Notes (Signed)
CSN: [id number]     Arrival date & time 11/03/16  1057 History   First MD Initiated Contact with Patient 11/03/16 1315     Chief Complaint  Patient presents with  . Emesis  . Fever  . Dizziness  . Back Pain   (Consider location/radiation/quality/duration/timing/severity/associated sxs/prior Treatment) 26 year old male presents to clinic with 5 day history of nausea and vomiting, no diarrhea, has had fever, weakness, and epigastric pain. Reports vomiting multiple times over previous 72 hours, none today, has not had anything to eat in 3 days, had only 1 bottle of Gatorade to drink yesterday, no other oral fluids. Dizziness is worse when arising, he has had no shortness of breath, no cough or congestion. He has had his appendix removed last April.    The history is provided by the patient.  Emesis  Associated symptoms: fever   Fever  Associated symptoms: vomiting   Dizziness  Associated symptoms: vomiting   Back Pain  Associated symptoms: fever     History reviewed. No pertinent past medical history. History reviewed. No pertinent surgical history. History reviewed. No pertinent family history. Social History  Substance Use Topics  . Smoking status: Never Smoker  . Smokeless tobacco: Never Used  . Alcohol use No    Review of Systems  Reason unable to perform ROS: as covered in HPI.  Constitutional: Positive for fever.  Gastrointestinal: Positive for vomiting.  Musculoskeletal: Positive for back pain.  Neurological: Positive for dizziness.  All other systems reviewed and are negative.   Allergies  Patient has no known allergies.  Home Medications   Prior to Admission medications   Not on File   Meds Ordered and Administered this Visit   Medications  sodium chloride 0.9 % bolus 1,000 mL (1,000 mLs Intravenous Given 11/03/16 1405)  acetaminophen (TYLENOL) tablet 650 mg (650 mg Oral Given 11/03/16 1246)  gi cocktail (Maalox,Lidocaine,Donnatal) (30 mLs Oral Given  11/03/16 1409)  ondansetron (ZOFRAN) injection 4 mg (4 mg Intravenous Given 11/03/16 1413)    BP 117/63 (BP Location: Right Arm)   Pulse 72   Temp 99 F (37.2 C) (Oral)   SpO2 99%  No data found.   Physical Exam  Constitutional: He is oriented to person, place, and time. He appears well-developed and well-nourished. No distress.  HENT:  Head: Normocephalic and atraumatic.  Right Ear: External ear normal.  Left Ear: External ear normal.  Cardiovascular: Normal rate and regular rhythm.   Pulmonary/Chest: Effort normal and breath sounds normal.  Abdominal: Normal appearance. Bowel sounds are decreased. There is no hepatosplenomegaly. There is tenderness in the epigastric area. There is no rigidity, no rebound, no guarding, no CVA tenderness, no tenderness at McBurney's point and negative Murphy's sign.  Neurological: He is alert and oriented to person, place, and time.  Skin: Skin is warm. Capillary refill takes less than 2 seconds. He is diaphoretic.  Psychiatric: He has a normal mood and affect.  Nursing note and vitals reviewed.   Urgent Care Course     Procedures (including critical care time)  Labs Review Labs Reviewed  POCT I-STAT, CHEM 8 - Abnormal; Notable for the following:       Result Value   Glucose, Bld 102 (*)    Hemoglobin 12.9 (*)    HCT 38.0 (*)    All other components within normal limits  POCT URINALYSIS DIP (DEVICE)    Imaging Review No results found.   Visual Acuity Review  Right Eye Distance:  Left Eye Distance:   Bilateral Distance:    Right Eye Near:   Left Eye Near:    Bilateral Near:         MDM   1. Epigastric pain   2. Dehydration   You have had 1 liter of IV fluid, IV Zofran, a GI cocktail of Maalox, Lidocaine, and Donnatal. Your iStat reading was mostly unremarkable with slight decrease in Hgb and Hct. Since your symptoms have not improved, I recommend you go to the emergency room for further treatment and evaluation.       Dorena Bodo, NP 11/03/16 [phone]

## 2016-11-03 NOTE — ED Notes (Signed)
Patient transported to X-ray 

## 2016-11-21 ENCOUNTER — Emergency Department (HOSPITAL_BASED_OUTPATIENT_CLINIC_OR_DEPARTMENT_OTHER)
Admission: EM | Admit: 2016-11-21 | Discharge: 2016-11-22 | Disposition: A | Discharge status: Home / Self Care | Payer: Self-pay | Attending: Emergency Medicine | Admitting: Emergency Medicine

## 2016-11-21 ENCOUNTER — Encounter (HOSPITAL_BASED_OUTPATIENT_CLINIC_OR_DEPARTMENT_OTHER): Payer: Self-pay | Admitting: Emergency Medicine

## 2016-11-21 DIAGNOSIS — L509 Urticaria, unspecified: Secondary | ICD-10-CM | POA: Insufficient documentation

## 2016-11-21 MED ORDER — PREDNISONE 10 MG PO TABS
60.0000 mg | ORAL_TABLET | Freq: Once | ORAL | Status: AC
  Administered 2016-11-21: 60 mg via ORAL
  Filled 2016-11-21: qty 1

## 2016-11-21 MED ORDER — PREDNISONE 20 MG PO TABS: 40.0000 mg | ORAL_TABLET | Freq: Every day | ORAL | 0 refills | Status: DC

## 2016-11-21 NOTE — ED Provider Notes (Signed)
[facility]   CSN: [id number] Arrival date & time: 11/21/16  2106  By signing my name below, I, Modena Jansky, attest that this documentation has been prepared under the direction and in the presence of non-physician practitioner, Roxy Horseman, PA-C. Electronically Signed: Modena Jansky, Scribe. 11/21/2016. 11:15 PM.  History   Chief Complaint No chief complaint on file.  The history is provided by the patient. No language interpreter was used.   HPI Comments: Kevin Benton is a 26 y.o. male who presents to the Emergency Department complaining of intermittent moderate rash that started last night. He noticed a rash to his BUE and BLE, and he suspects he has an allergic reaction to a new soap. He took OTC allergy medication last night with minimal relief, but today it worsened so he came to the ED. He describes the rash as itchy and burning. He reports associated throat swelling. He admits to a Benadryl allergy. He denies any drooling or other complaints.   History reviewed. No pertinent past medical history.  There are no active problems to display for this patient.   History reviewed. No pertinent surgical history.     Home Medications    Prior to Admission medications   Medication Sig Start Date End Date Taking? Authorizing Provider  ondansetron (ZOFRAN) 4 MG tablet Take 1 tablet (4 mg total) by mouth every 6 (six) hours. 11/03/16   Audry Pili, PA-C  Pseudoephedrine-APAP-DM (DAYQUIL MULTI-SYMPTOM COLD/FLU PO) Take 1 capsule by mouth every 4 (four) hours as needed (for symptoms).     Historical Provider, MD    Family History History reviewed. No pertinent family history.  Social History Social History  Substance Use Topics  . Smoking status: Never Smoker  . Smokeless tobacco: Never Used  . Alcohol use No     Allergies   Patient has no known allergies.   Review of Systems Review of Systems  HENT: Positive for trouble swallowing.     Skin: Positive for rash.  All other systems reviewed and are negative.    Physical Exam Updated Vital Signs BP 129/84 (BP Location: Right Arm)   Pulse 66   Temp 98.1 F (36.7 C) (Oral)   Resp 18   Ht 5\' 8"  (1.727 m)   Wt 140 lb (63.5 kg)   SpO2 100%   BMI 21.29 kg/m   Physical Exam  Constitutional: He appears well-developed and well-nourished. No distress.  HENT:  Head: Normocephalic and atraumatic.  Airway is clear No stridor  Eyes: Conjunctivae are normal.  Neck: Neck supple.  Cardiovascular: Normal rate.   Pulmonary/Chest: Effort normal and breath sounds normal.  CTAB  Abdominal: Soft. He exhibits no distension.  Musculoskeletal: Normal range of motion.  Neurological: He is alert.  Skin: Skin is warm and dry. No rash noted.  Faint resolving hives  Psychiatric: He has a normal mood and affect.  Nursing note and vitals reviewed.    ED Treatments / Results  DIAGNOSTIC STUDIES: Oxygen Saturation is 100% on RA, normal by my interpretation.    COORDINATION OF CARE: 11:19 PM- Pt advised of plan for treatment and pt agrees.  Labs (all labs ordered are listed, but only abnormal results are displayed) Labs Reviewed - No data to display  EKG  EKG Interpretation None       Radiology No results found.  Procedures Procedures (including critical care time)  Medications Ordered in ED Medications - No data to display   Initial Impression / Assessment and  Plan / ED Course  I have reviewed the triage vital signs and the nursing notes.  Pertinent labs & imaging results that were available during my care of the patient were reviewed by me and considered in my medical decision making (see chart for details).     Faint resolving hives.  No evidence of anaphylaxis.  Allergic to benadryl.  Will give prednisone.  Recommend follow-up with PCP or dermatology. VSS.  Final Clinical Impressions(s) / ED Diagnoses   Final diagnoses:  Hives    New  Prescriptions New Prescriptions   PREDNISONE (DELTASONE) 20 MG TABLET    Take 2 tablets (40 mg total) by mouth daily.   I personally performed the services described in this documentation, which was scribed in my presence. The recorded information has been reviewed and is accurate.       Roxy HorsemanRobert Mahir Prabhakar, PA-C 11/21/16 2356    April Palumbo, MD 11/22/16 [phone]

## 2016-11-21 NOTE — ED Triage Notes (Signed)
Patient states that last night that he started to have a rash last night on his legs and arms. The patient reports that he took medications last night and now it is getting worse again. Patient in no distress at this time

## 2018-01-29 ENCOUNTER — Other Ambulatory Visit: Payer: Self-pay

## 2018-01-29 ENCOUNTER — Encounter (HOSPITAL_COMMUNITY): Payer: Self-pay

## 2018-01-29 ENCOUNTER — Emergency Department (HOSPITAL_COMMUNITY)
Admission: EM | Admit: 2018-01-29 | Discharge: 2018-01-29 | Disposition: A | Discharge status: Home / Self Care | Payer: BLUE CROSS/BLUE SHIELD | Attending: Emergency Medicine | Admitting: Emergency Medicine

## 2018-01-29 DIAGNOSIS — Z79899 Other long term (current) drug therapy: Secondary | ICD-10-CM | POA: Insufficient documentation

## 2018-01-29 DIAGNOSIS — R1031 Right lower quadrant pain: Secondary | ICD-10-CM

## 2018-01-29 LAB — URINALYSIS, ROUTINE W REFLEX MICROSCOPIC
BILIRUBIN URINE: NEGATIVE
GLUCOSE, UA: NEGATIVE mg/dL
HGB URINE DIPSTICK: NEGATIVE
KETONES UR: NEGATIVE mg/dL
LEUKOCYTES UA: NEGATIVE
Nitrite: NEGATIVE
PH: 5 (ref 5.0–8.0)
Protein, ur: NEGATIVE mg/dL
Specific Gravity, Urine: 1.017 (ref 1.005–1.030)

## 2018-01-29 LAB — CBC WITH DIFFERENTIAL/PLATELET
BASOS ABS: 0 10*3/uL (ref 0.0–0.1)
Basophils Relative: 0 %
Eosinophils Absolute: 0.2 10*3/uL (ref 0.0–0.7)
Eosinophils Relative: 3 %
HEMATOCRIT: 38.3 % — AB (ref 39.0–52.0)
Hemoglobin: 13.2 g/dL (ref 13.0–17.0)
LYMPHS PCT: 29 %
Lymphs Abs: 1.9 10*3/uL (ref 0.7–4.0)
MCH: 30.6 pg (ref 26.0–34.0)
MCHC: 34.5 g/dL (ref 30.0–36.0)
MCV: 88.7 fL (ref 78.0–100.0)
Monocytes Absolute: 0.4 10*3/uL (ref 0.1–1.0)
Monocytes Relative: 6 %
NEUTROS ABS: 4 10*3/uL (ref 1.7–7.7)
Neutrophils Relative %: 62 %
PLATELETS: 205 10*3/uL (ref 150–400)
RBC: 4.32 MIL/uL (ref 4.22–5.81)
RDW: 12.4 % (ref 11.5–15.5)
WBC: 6.6 10*3/uL (ref 4.0–10.5)

## 2018-01-29 LAB — BASIC METABOLIC PANEL
ANION GAP: 7 (ref 5–15)
BUN: 9 mg/dL (ref 6–20)
CO2: 27 mmol/L (ref 22–32)
Calcium: 9.4 mg/dL (ref 8.9–10.3)
Chloride: 107 mmol/L (ref 101–111)
Creatinine, Ser: 0.94 mg/dL (ref 0.61–1.24)
GFR calc Af Amer: >60 mL/min (ref >60)
GFR calc non Af Amer: >60 mL/min (ref >60)
GLUCOSE: 100 mg/dL — AB (ref 65–99)
Potassium: 4 mmol/L (ref 3.5–5.1)
Sodium: 141 mmol/L (ref 135–145)

## 2018-01-29 MED ORDER — PANTOPRAZOLE SODIUM 20 MG PO TBEC: 20.0000 mg | DELAYED_RELEASE_TABLET | Freq: Every day | ORAL | 0 refills | Status: DC

## 2018-01-29 NOTE — ED Triage Notes (Signed)
Pt presents for evaluation of R sided groin pain. States has been going on x 1.5 years, no dx. States worsened over past 4 days. Pt states hx of appendectomy. No N/V/D. Denies swelling.

## 2018-01-29 NOTE — Discharge Instructions (Addendum)
Please see the information and instructions below regarding your visit.  Your diagnoses today include:  1. Right inguinal pain     Tests performed today include: See side panel of your discharge paperwork for testing performed today. Vital signs are listed at the bottom of these instructions.   Your blood work and urine tests were completely normal today.  Medications prescribed:    Take any prescribed medications only as prescribed, and any over the counter medications only as directed on the packaging.  Please continue taking ibuprofen and Tylenol, as well as the Protonix I prescribed.  Home care instructions:  Please follow any educational materials contained in this packet.   I prescribed some adductor muscle exercises.  This is not necessarily what is causing your pain, however these are exercises you can do.  Follow-up instructions: Please follow-up with your primary care provider as soon as possible for further evaluation of your symptoms if they are not completely improved.   Please follow up with urology as soon as possible.  Return instructions:  Please return to the Emergency Department if you experience worsening symptoms.  Please return the emergency department for any worsening pain, nausea or vomiting that prevents you from keeping anything down, fever with pain, testicular swelling or redness, or discharge from your penis. Please return if you have any other emergent concerns.  Additional Information:   Your vital signs today were: BP 120/85 (BP Location: Right Arm)    Pulse 69    Temp 98.3 F (36.8 C) (Oral)    Resp 16    Ht  (1.727 m)    Wt 62.6 kg (138 lb)    SpO2 99%    BMI 20.98 kg/m  If your blood pressure (BP) was elevated on multiple readings during this visit above 130 for the top number or above 80 for the bottom number, please have this repeated by your primary care provider within one month. --------------  Thank you for allowing [place] to  participate in your care today.

## 2018-01-29 NOTE — ED Provider Notes (Signed)
[facility] EMERGENCY DEPARTMENT Provider Note   CSN: [id number] Arrival date & time: 01/29/18  1052     History   Chief Complaint Chief Complaint  Patient presents with  . Groin Pain    HPI Kevin Benton is a 27 y.o. male.  HPI  Patient is a 27 year old male with no sniffing past medical history and abdominal surgical history significant for appendectomy presenting for right groin pain.  Patient reports he began feeling this pain approximate 1.5 years ago, however for the past 5 days, and has been more constant and worsening.  Patient describes it as a sharp pain.  Patient reports typically will be worse at night, but can hurt all the time.  Patient also reports he has had intermittent suprapubic discomfort on both sides.  Patient reports the right-sided pain will radiate down to his testicle, but there is no erythema, masses, or swelling of the testicle.  Patient denies any nausea, vomiting, diarrhea, constipation, melena or hematochezia, flank pain,  penile discharge or penile pain, dysuria, rectal pain, pain with bowel movements.  Patient does report he has had urinary urgency, and will urinate multiple times an hour.  Patient reports he has had multiple evaluations for this to rule out diabetes or other metabolic causes of this.  Patient denies any masses or swelling in the inguinal region.  Patient reports he is sexually active with one male partner for the past 2 years, and that partner was recently tested for STI and it was negative.  Patient denies concern for STI at this time.  Patient reports he has been taking ibuprofen and Tylenol without full relief.  Patient also known he has had some increase in epigastric pain after eating.  Patient reports he is not experiencing this now.  No history of heavy alcohol use.  History reviewed. No pertinent past medical history.  There are no active problems to display for this patient.   History reviewed. No  pertinent surgical history.      Home Medications    Prior to Admission medications   Medication Sig Start Date End Date Taking? Authorizing Provider  ondansetron (ZOFRAN) 4 MG tablet Take 1 tablet (4 mg total) by mouth every 6 (six) hours. 11/03/16   Audry Pili, PA-C  pantoprazole (PROTONIX) 20 MG tablet Take 1 tablet (20 mg total) by mouth daily. 01/29/18   Aviva Kluver B, PA-C  predniSONE (DELTASONE) 20 MG tablet Take 2 tablets (40 mg total) by mouth daily. 11/21/16   Roxy Horseman, PA-C  Pseudoephedrine-APAP-DM (DAYQUIL MULTI-SYMPTOM COLD/FLU PO) Take 1 capsule by mouth every 4 (four) hours as needed (for symptoms).     [provider]    Family History No family history on file.  Social History Social History   Tobacco Use  . Smoking status: Never Smoker  . Smokeless tobacco: Never Used  Substance Use Topics  . Alcohol use: No  . Drug use: No     Allergies   Patient has no known allergies.   Review of Systems Review of Systems  Constitutional: Negative for chills and fever.  Gastrointestinal: Positive for abdominal pain. Negative for nausea and vomiting.  Genitourinary: Positive for frequency and testicular pain. Negative for discharge, dysuria, flank pain, genital sores, penile pain, penile swelling and scrotal swelling.  Musculoskeletal: Negative for back pain.     Physical Exam Updated Vital Signs BP 120/85 (BP Location: Right Arm)   Pulse 69   Temp 98.3 F (36.8 C) (Oral)  Resp 16   Ht  (1.727 m)   Wt 62.6 kg (138 lb)   SpO2 99%   BMI 20.98 kg/m   Physical Exam  Constitutional: He appears well-developed and well-nourished. No distress.  HENT:  Head: Normocephalic and atraumatic.  Mouth/Throat: Oropharynx is clear and moist.  Eyes: Pupils are equal, round, and reactive to light. Conjunctivae and EOM are normal.  Neck: Normal range of motion. Neck supple.  Cardiovascular: Normal rate, regular rhythm, S1 normal and S2 normal.  No  murmur heard. Pulmonary/Chest: Effort normal and breath sounds normal. He has no wheezes. He has no rales.  Abdominal: Soft. He exhibits no distension. There is no guarding. No hernia.  Patient tender along bilateral inguinal ligaments, however more notably on right.  Pain improves after palpation ceases.  Genitourinary:  Genitourinary Comments: Examination performed with EMT chaperone present.  Patient exhibits no femoral hernia on Valsalva, or direct or indirect hernia on Valsalva.  Patient reports he is uncomfortable to palpation of the right testicle, however reports he feels it is radiating from the abdomen.  There is no swelling, mass, or erythema of bilateral testicles.  Musculoskeletal: Normal range of motion. He exhibits no edema or deformity.  Lymphadenopathy:    He has no cervical adenopathy.  Neurological: He is alert.  Cranial nerves grossly intact. Patient moves extremities symmetrically and with good coordination.  Skin: Skin is warm and dry. No rash noted. No erythema.  Psychiatric: He has a normal mood and affect. His behavior is normal. Judgment and thought content normal.  Nursing note and vitals reviewed.    ED Treatments / Results  Labs (all labs ordered are listed, but only abnormal results are displayed) Labs Reviewed  BASIC METABOLIC PANEL - Abnormal; Notable for the following components:      Result Value   Glucose, Bld 100 (*)    All other components within normal limits  CBC WITH DIFFERENTIAL/PLATELET - Abnormal; Notable for the following components:   HCT 38.3 (*)    All other components within normal limits  URINALYSIS, ROUTINE W REFLEX MICROSCOPIC    EKG None  Radiology No results found.  Procedures Procedures (including critical care time)  Medications Ordered in ED Medications - No data to display   Initial Impression / Assessment and Plan / ED Course  I have reviewed the triage vital signs and the nursing notes.  Pertinent labs &  imaging results that were available during my care of the patient were reviewed by me and considered in my medical decision making (see chart for details).  Clinical Course as of Jan 29 1945  Fri Jan 29, 2018  1613 Engaged in shared decision making with patient over imaging and differential diagnosis.  Answered patient's questions regarding concerns such as pancreatitis.  I discussed with the patient that his findings and exam are atypical for acute abdomen, and that he should be reevaluated within the next couple days he is having persistent and worsening pain.   [AM]    Clinical Course User Index [AM] Elisha Ponder, PA-C   Patient well-appearing in no acute distress.  Abdomen is benign and nonsurgical on examination.  No hernias palpated on examination, and given patient's body habitus, doubt incarcerated hernia as cause of patient's increased pain.  Also doubt appendicitis, as patient has had appendectomy.  History and examination are not consistent with nephrolithiasis.  Doubt epididymitis or orchitis as cause of patient's pain, given the length of time the pain is persisted, and examination  findings not consistent with this pathology.  Lab work and urinalysis without acute abnormality.  Suspect inguinal ligament pathology, athletic pubipalgia, or nerve entrapment.  Patient denies having a PCP at this time, but does have insurance.  Advised to find a PCP, as well as seeing urology about frequent urination.  Pressure decision making discussion, patient deferring imaging at this time, and will return for any worsening pain, tractable nausea or vomiting, fever chills with pain.  Patient also prescribed Protonix given postprandial epigastric discomfort.  Patient is in understanding and agrees with the plan of care.  This is a supervised visit with Dr. Drema Pry. Evaluation, management, and discharge planning discussed with this attending physician.  Final Clinical Impressions(s) / ED Diagnoses     Final diagnoses:  Right inguinal pain    ED Discharge Orders        Ordered    pantoprazole (PROTONIX) 20 MG tablet  Daily     01/29/18 1747       Delia Chimes 01/29/18 2006    Cardama, Pedro Eduardo, MD 01/30/18 [phone]

## 2018-03-09 ENCOUNTER — Ambulatory Visit: Payer: BLUE CROSS/BLUE SHIELD | Admitting: Physician Assistant

## 2018-03-09 ENCOUNTER — Encounter: Payer: Self-pay | Admitting: Physician Assistant

## 2018-03-09 VITALS — BP 112/60 | HR 64 | Temp 98.4°F | Resp 16 | Ht 68.0 in | Wt 138.0 lb

## 2018-03-09 DIAGNOSIS — G8929 Other chronic pain: Secondary | ICD-10-CM | POA: Diagnosis not present

## 2018-03-09 DIAGNOSIS — R350 Frequency of micturition: Secondary | ICD-10-CM | POA: Diagnosis not present

## 2018-03-09 DIAGNOSIS — R1031 Right lower quadrant pain: Secondary | ICD-10-CM

## 2018-03-09 NOTE — Patient Instructions (Signed)

## 2018-03-09 NOTE — Progress Notes (Signed)
Patient: Kevin Benton Male    DOB: 06/05/1991   27 y.o.   MRN: [id number] Visit Date: 03/24/2018  Today's Provider: Trey SailorsAdriana M Karimah Winquist, PA-C   Chief Complaint  Patient presents with  . Establish Care  . Abdominal Pain    Lower right side; Needs referral to Urology   Subjective:   right side back pain constant Urinary frequency x 1 year 45 times a day. - 60 times a day  Mother had her bladder stretched, and stomach stretched - pt thinks this may be related. Mother age 26 dementia associated with Parkinson's disease Nauseated 24/7 Painless urination [facility]    Abdominal Pain  This is a chronic problem. The current episode started more than 1 year ago. The onset quality is gradual. The problem has been gradually worsening. The pain is located in the RUQ. The quality of the pain is aching, cramping and sharp. The abdominal pain radiates to the back. Associated symptoms include anorexia, arthralgias, constipation, diarrhea (Occasionally), frequency, headaches, hematuria, nausea and vomiting (Occasionally). Pertinent negatives include no fever or myalgias.    No Known Allergies   Current Outpatient Medications:  .  ondansetron (ZOFRAN) 4 MG tablet, Take 1 tablet (4 mg total) by mouth every 6 (six) hours. (Patient not taking: Reported on 03/09/2018), Disp: 12 tablet, Rfl: 0 .  pantoprazole (PROTONIX) 20 MG tablet, Take 1 tablet (20 mg total) by mouth daily. (Patient not taking: Reported on 03/09/2018), Disp: 30 tablet, Rfl: 0 .  predniSONE (DELTASONE) 20 MG tablet, Take 2 tablets (40 mg total) by mouth daily. (Patient not taking: Reported on 03/09/2018), Disp: 10 tablet, Rfl: 0 .  Pseudoephedrine-APAP-DM (DAYQUIL MULTI-SYMPTOM COLD/FLU PO), Take 1 capsule by mouth every 4 (four) hours as needed (for symptoms). , Disp: , Rfl:   Review of Systems  Constitutional: Positive for appetite change. Negative for activity change, chills, diaphoresis, fatigue, fever  and unexpected weight change.  HENT: Negative.   Eyes: Negative.   Respiratory: Negative.   Cardiovascular: Negative.   Gastrointestinal: Positive for abdominal pain, anorexia, blood in stool, constipation, diarrhea (Occasionally), nausea and vomiting (Occasionally). Negative for abdominal distention, anal bleeding and rectal pain.  Endocrine: Negative.   Genitourinary: Positive for frequency and hematuria. Negative for decreased urine volume, difficulty urinating, discharge, enuresis, flank pain, genital sores, penile pain, penile swelling, scrotal swelling, testicular pain and urgency.  Musculoskeletal: Positive for arthralgias and back pain. Negative for gait problem, joint swelling, myalgias, neck pain and neck stiffness.  Skin: Negative.   Allergic/Immunologic: Negative.   Neurological: Positive for headaches. Negative for dizziness and light-headedness.  Hematological: Negative.   Psychiatric/Behavioral: Positive for sleep disturbance. Negative for agitation, behavioral problems, confusion, decreased concentration, dysphoric mood, hallucinations, self-injury and suicidal ideas. The patient is not nervous/anxious and is not hyperactive.     Social History   Tobacco Use  . Smoking status: Never Smoker  . Smokeless tobacco: Never Used  Substance Use Topics  . Alcohol use: No    Comment: Rarely   Objective:   BP 112/60 (BP Location: Right Arm, Patient Position: Sitting, Cuff Size: Normal)   Pulse 64   Temp 98.4 F (36.9 C) (Oral)   Resp 16   Ht 5\' 8"  (1.727 m)   Wt 138 lb (62.6 kg)   BMI 20.98 kg/m  Vitals:   03/09/18 1456  BP: 112/60  Pulse: 64  Resp: 16  Temp: 98.4 F (36.9 C)  TempSrc: Oral  Weight:  138 lb (62.6 kg)  Height: 5\' 8"  (1.727 m)     Physical Exam  Constitutional: He is oriented to person, place, and time. He appears well-developed and well-nourished.  Abdominal: Normal appearance and bowel sounds are normal.  Neurological: He is alert and oriented  to person, place, and time.  Skin: Skin is warm and dry.  Psychiatric: He has a normal mood and affect. His behavior is normal.        Assessment & Plan:     1. Frequent urination  - Ambulatory referral to Urology - Chlamydia/Gonococcus/Trichomonas, NAA  2. Chronic RLQ pain  - Ambulatory referral to Urology  Return in about 1 year (around 03/10/2019) for CPE.  The entirety of the information documented in the History of Present Illness, Review of Systems and Physical Exam were personally obtained by me. Portions of this information were initially documented by Kavin Leech, CMA and reviewed by me for thoroughness and accuracy.        Trey Sailors, PA-C  [facility]

## 2018-03-10 LAB — CHLAMYDIA/GONOCOCCUS/TRICHOMONAS, NAA
Chlamydia by NAA: NEGATIVE
Gonococcus by NAA: NEGATIVE
Trich vag by NAA: NEGATIVE

## 2018-03-11 ENCOUNTER — Telehealth: Payer: Self-pay

## 2018-03-11 NOTE — Telephone Encounter (Signed)
-----   Message from Trey SailorsAdriana M Pollak, [place]PA-C sent at 03/10/2018  4:54 PM EDT ----- Sharrell KuSti testing all negative.

## 2018-03-11 NOTE — Telephone Encounter (Signed)
Pt advised.   Thanks,   -Kevin Benton  

## 2018-04-12 NOTE — Progress Notes (Signed)
04/13/2018 12:29 PM   Kevin Benton 1990/12/10 [id number]  Referring provider: Trey Sailors, PA-C [address] [place], [place] [address]  Chief Complaint  Patient presents with  . Urinary Frequency    HPI: Patient is a 27 year old Caucasian male who is referred by Trey Sailors, PA-C for RLQ and frequency.  He states he has been experiencing frequency x 50, strong urgency, nocturia x 6-7 and gross hematuria for two years.  He is having pain down his right quads and up from his right buttocks to the right flank.    He is now having constant pain in his right buttocks associated with headaches.  He has noticed that walking and carrying anything heavy makes it worse.  A heating pad can help with the pain.  He describes the pain as shooting pain.  10/10 pain at its worse and 2/10 pain constantly.   Patient denies any fevers, chills, nausea or vomiting.  His UA is negative.  His PVR is 0 mL.    Chlamydia and gonorrhea cultures were negative in 02/2018.    PMH: History reviewed. No pertinent past medical history.  Surgical History: History reviewed. No pertinent surgical history.  Home Medications:  Allergies as of 04/13/2018   No Known Allergies     Medication List        Accurate as of 04/13/18 12:29 PM. Always use your most recent med list.          DAYQUIL MULTI-SYMPTOM COLD/FLU PO Take 1 capsule by mouth every 4 (four) hours as needed (for symptoms).   ondansetron 4 MG tablet Commonly known as:  ZOFRAN Take 1 tablet (4 mg total) by mouth every 6 (six) hours.   pantoprazole 20 MG tablet Commonly known as:  PROTONIX Take 1 tablet (20 mg total) by mouth daily.   predniSONE 20 MG tablet Commonly known as:  DELTASONE Take 2 tablets (40 mg total) by mouth daily.       Allergies: No Known Allergies  Family History: Family History  Problem Relation Age of Onset  . Dementia Mother   . Leukemia Maternal Grandmother   . Lung cancer  Maternal Grandfather   . Leukemia Paternal Grandmother   . Diabetes Paternal Grandfather     Social History:  reports that he has never smoked. He has never used smokeless tobacco. He reports that he does not drink alcohol or use drugs.  ROS: UROLOGY Frequent Urination?: Yes Hard to postpone urination?: Yes Burning/pain with urination?: No Get up at night to urinate?: Yes Leakage of urine?: No Urine stream starts and stops?: No Trouble starting stream?: No Do you have to strain to urinate?: Yes Blood in urine?: No Urinary tract infection?: No Sexually transmitted disease?: No Injury to kidneys or bladder?: No Painful intercourse?: No Weak stream?: No Erection problems?: No Penile pain?: No  Gastrointestinal Nausea?: Yes Vomiting?: No Indigestion/heartburn?: Yes Diarrhea?: No Constipation?: No  Constitutional Fever: No Night sweats?: No Weight loss?: No Fatigue?: Yes  Skin Skin rash/lesions?: No Itching?: No  Eyes Blurred vision?: No Double vision?: No  Ears/Nose/Throat Sore throat?: No Sinus problems?: No  Hematologic/Lymphatic Swollen glands?: No Easy bruising?: No  Cardiovascular Leg swelling?: No Chest pain?: No  Respiratory Cough?: No Shortness of breath?: No  Endocrine Excessive thirst?: No  Musculoskeletal Back pain?: Yes Joint pain?: Yes  Neurological Headaches?: Yes Dizziness?: No  Psychologic Depression?: No Anxiety?: Yes  Physical Exam: BP 108/63   Pulse 66  Resp 16   Ht 5\' 8"  (1.727 m)   Wt 137 lb (62.1 kg)   SpO2 96%   BMI 20.83 kg/m   Constitutional:  Well nourished. Alert and oriented, No acute distress. HEENT: [place] AT, moist mucus membranes.  Trachea midline, no masses. Cardiovascular: No clubbing, cyanosis, or edema. Respiratory: Normal respiratory effort, no increased work of breathing. GI: Abdomen is soft, non tender, non distended, no abdominal masses. Liver and spleen not palpable.  No hernias appreciated.   Stool sample for occult testing is not indicated.   GU: No CVA tenderness.  No bladder fullness or masses.  Patient with circumcised phallus.  Urethral meatus is patent.  No penile discharge. No penile lesions or rashes. Scrotum without lesions, cysts, rashes and/or edema.  Testicles are located scrotally bilaterally. No masses are appreciated in the testicles. Left and right epididymis are normal. Rectal: Patient with  normal sphincter tone. Anus and perineum without scarring or rashes. No rectal masses are appreciated. Prostate is approximately 35 grams, no nodules are appreciated. Seminal vesicles are normal. Skin: No rashes, bruises or suspicious lesions. Lymph: No cervical or inguinal adenopathy. Neurologic: Grossly intact, no focal deficits, moving all 4 extremities. Psychiatric: Normal mood and affect.  Laboratory Data: Lab Results  Component Value Date   WBC 6.6 01/29/2018   HGB 13.2 01/29/2018   HCT 38.3 (L) 01/29/2018   MCV 88.7 01/29/2018   PLT 205 01/29/2018    Lab Results  Component Value Date   CREATININE 0.94 01/29/2018    No results found for: PSA  No results found for: TESTOSTERONE  No results found for: HGBA1C  No results found for: TSH  No results found for: CHOL, HDL, CHOLHDL, VLDL, LDLCALC  Lab Results  Component Value Date   AST 21 10/12/2010   Lab Results  Component Value Date   ALT 16 10/12/2010   No components found for: ALKALINEPHOPHATASE No components found for: BILIRUBINTOTAL  No results found for: ESTRADIOL  Urinalysis See HPI and Epic   I have reviewed the labs.   Pertinent Imaging: Results for ANAND, TEJADA (MRN [id number]) as of 04/13/2018 12:36  Ref. Range 04/13/2018 11:21  Scan Result Unknown 0    Assessment & Plan:    1. Gross hematuria I explained to the patient that there are a number of causes that can be associated with blood in the urine, such as stones,  BPH, UTI's, damage to the urinary tract and/or cancer. At  this time, I felt that the patient warranted further urologic evaluation with gross hematuria.  The AUA guidelines state that a CT urogram is the preferred imaging study to evaluate hematuria. I explained to the patient that a contrast material will be injected into a vein and that in rare instances, an allergic reaction can result and may even life threatening   The patient denies any allergies to contrast, iodine and/or seafood and is not taking metformin Following the imaging study,  I've recommended a cystoscopy. I described how this is performed, typically in an office setting with a flexible cystoscope. We described the risks, benefits, and possible side effects, the most common of which is a minor amount of blood in the urine and/or burning which usually resolves in 24 to 48 hours.  The patient had the opportunity to ask questions which were answered. Based upon this discussion, the patient is willing to proceed. Therefore, I've ordered: a CT Urogram and cystoscopy. The patient will return following all of the above for discussion  of the results.  UA is negative Urine culture is pending BUN + creatinine is pending  Valium given   2. Urinary frequency Myrbetriq 25 mg daily, #28 samples,  I have advised the patient of the side effects of Myrbetriq, such as: elevation in BP, urinary retention and/or HA. Will need to reassess if hematuria workup does not find an etiology for his urinary symptoms  3. Right sided pain - see above    Return for CT Urogram report and cystoscopy.  These notes generated with voice recognition software. I apologize for typographical errors.  Michiel CowboySHANNON Phillips Goulette, PA-C  [facility] [address]  [address] [place], [place] [address] [phone]

## 2018-04-13 ENCOUNTER — Encounter: Payer: Self-pay | Admitting: Urology

## 2018-04-13 ENCOUNTER — Ambulatory Visit: Payer: BLUE CROSS/BLUE SHIELD | Admitting: Urology

## 2018-04-13 VITALS — BP 108/63 | HR 66 | Resp 16 | Ht 68.0 in | Wt 137.0 lb

## 2018-04-13 DIAGNOSIS — R310 Gross hematuria: Secondary | ICD-10-CM | POA: Diagnosis not present

## 2018-04-13 DIAGNOSIS — R350 Frequency of micturition: Secondary | ICD-10-CM | POA: Diagnosis not present

## 2018-04-13 LAB — MICROSCOPIC EXAMINATION
Epithelial Cells (non renal): NONE SEEN /hpf (ref 0–10)
RBC MICROSCOPIC, UA: NONE SEEN /HPF (ref 0–2)
WBC UA: NONE SEEN /HPF (ref 0–5)

## 2018-04-13 LAB — URINALYSIS, COMPLETE
Bilirubin, UA: NEGATIVE
Glucose, UA: NEGATIVE
Ketones, UA: NEGATIVE
Leukocytes, UA: NEGATIVE
Nitrite, UA: NEGATIVE
Protein, UA: NEGATIVE
RBC, UA: NEGATIVE
Specific Gravity, UA: 1.025 (ref 1.005–1.030)
Urobilinogen, Ur: 0.2 mg/dL (ref 0.2–1.0)
pH, UA: 7 (ref 5.0–7.5)

## 2018-04-13 LAB — BLADDER SCAN AMB NON-IMAGING: Scan Result: 0

## 2018-04-13 MED ORDER — DIAZEPAM 10 MG PO TABS: ORAL_TABLET | ORAL | 0 refills | Status: DC

## 2018-05-10 ENCOUNTER — Ambulatory Visit: Payer: BLUE CROSS/BLUE SHIELD

## 2018-05-13 ENCOUNTER — Other Ambulatory Visit: Payer: BLUE CROSS/BLUE SHIELD | Admitting: Urology

## 2018-06-01 ENCOUNTER — Ambulatory Visit: Admission: RE | Admit: 2018-06-01 | Payer: BC Managed Care – PPO | Source: Ambulatory Visit

## 2018-06-07 ENCOUNTER — Encounter: Payer: Self-pay | Admitting: Urology

## 2018-06-07 ENCOUNTER — Telehealth: Payer: Self-pay | Admitting: Urology

## 2018-06-07 ENCOUNTER — Other Ambulatory Visit: Payer: BLUE CROSS/BLUE SHIELD | Admitting: Urology

## 2018-06-07 NOTE — Telephone Encounter (Signed)
Pt was a no-show for cysto today and didn't have CT done either.  Just F.Y.I.

## 2018-07-20 ENCOUNTER — Telehealth: Payer: Self-pay | Admitting: Urology

## 2018-07-20 NOTE — Telephone Encounter (Signed)
Pt would like to go to [facility] for his CT scan.  They are the only ones who will take a payment plan  [phone]  (facility #)  Pt's number [phone]  He had an appt for CT and cysto and cancelled both because they wanted payment up front he said.

## 2018-07-21 NOTE — Telephone Encounter (Signed)
I spoke with the patient and advised him that we do make payment plans here and that he did not need to pay anything upfront. He has decided to have his CT scan here @ [facility] and [name] in scheduling will call him to get this rescheduled.   Kevin Benton

## 2018-07-26 ENCOUNTER — Ambulatory Visit
Admission: RE | Admit: 2018-07-26 | Discharge: 2018-07-26 | Disposition: A | Discharge status: Home / Self Care | Payer: BC Managed Care – PPO | Source: Ambulatory Visit | Attending: Urology | Admitting: Urology

## 2018-07-26 DIAGNOSIS — R310 Gross hematuria: Secondary | ICD-10-CM

## 2018-07-26 DIAGNOSIS — N132 Hydronephrosis with renal and ureteral calculous obstruction: Secondary | ICD-10-CM | POA: Insufficient documentation

## 2018-07-26 MED ORDER — IOPAMIDOL (ISOVUE-300) INJECTION 61%
150.0000 mL | Freq: Once | INTRAVENOUS | Status: AC | PRN
  Administered 2018-07-26: 125 mL via INTRAVENOUS

## 2018-07-28 ENCOUNTER — Encounter: Payer: Self-pay | Admitting: Radiology

## 2018-07-28 ENCOUNTER — Other Ambulatory Visit: Payer: Self-pay | Admitting: Radiology

## 2018-07-28 ENCOUNTER — Encounter: Payer: Self-pay | Admitting: Urology

## 2018-07-28 ENCOUNTER — Other Ambulatory Visit: Payer: Self-pay

## 2018-07-28 ENCOUNTER — Ambulatory Visit (INDEPENDENT_AMBULATORY_CARE_PROVIDER_SITE_OTHER): Payer: BC Managed Care – PPO | Admitting: Urology

## 2018-07-28 VITALS — BP 114/57 | HR 60 | Ht 68.0 in | Wt 141.4 lb

## 2018-07-28 DIAGNOSIS — I25118 Atherosclerotic heart disease of native coronary artery with other forms of angina pectoris: Secondary | ICD-10-CM | POA: Diagnosis not present

## 2018-07-28 DIAGNOSIS — N201 Calculus of ureter: Secondary | ICD-10-CM

## 2018-07-28 DIAGNOSIS — N3000 Acute cystitis without hematuria: Secondary | ICD-10-CM

## 2018-07-28 DIAGNOSIS — R310 Gross hematuria: Secondary | ICD-10-CM

## 2018-07-28 LAB — URINALYSIS, COMPLETE
Bilirubin, UA: NEGATIVE
GLUCOSE, UA: NEGATIVE
KETONES UA: NEGATIVE
LEUKOCYTES UA: NEGATIVE
NITRITE UA: NEGATIVE
PROTEIN UA: NEGATIVE
RBC, UA: NEGATIVE
SPEC GRAV UA: 1.025 (ref 1.005–1.030)
Urobilinogen, Ur: 0.2 mg/dL (ref 0.2–1.0)
pH, UA: 5.5 (ref 5.0–7.5)

## 2018-07-28 LAB — MICROSCOPIC EXAMINATION: Epithelial Cells (non renal): NONE SEEN /hpf (ref 0–10)

## 2018-07-28 NOTE — Progress Notes (Signed)
07/28/2018 9:48 AM   Peyton Najjar [birth year] [id number]  Referring provider: Trey Sailors, PA-C [address] [place], [place] [address]  CC: right flank and groin pain  HPI: I saw Mr. Bollig in urology clinic today for discussion of right-sided flank pain.  He was previously seen by our PA and underwent hematuria work-up, and CT urogram 07/26/2018 showed mild right hydronephrosis and hydroureter associated with a 4 mm distal ureteral calculus.  He is an otherwise healthy 27 year old male who reports multiple month history of right-sided flank pain with urgency and frequency.  There are no aggravating or alleviating factors.  He denies any fevers or chills.  He denies any prior stone history.  He has had some hematuria over the last few weeks associated with flank pain.   PMH: History reviewed. No pertinent past medical history.  Surgical History: History reviewed. No pertinent surgical history.  Allergies:  Allergies  Allergen Reactions  . Benadryl [Diphenhydramine] Hives  . Flexeril [Cyclobenzaprine] Itching    Itching & burning    Family History: Family History  Problem Relation Age of Onset  . Dementia Mother   . Leukemia Maternal Grandmother   . Lung cancer Maternal Grandfather   . Leukemia Paternal Grandmother   . Diabetes Paternal Grandfather     Social History:  reports that he has never smoked. He has never used smokeless tobacco. He reports that he does not drink alcohol or use drugs.  ROS: Please see flowsheet from today's date for complete review of systems.  Physical Exam: BP (!) 114/57   Pulse 60   Ht 5\' 8"  (1.727 m)   Wt 141 lb 6.4 oz (64.1 kg)   BMI 21.50 kg/m    Constitutional:  Alert and oriented, No acute distress. Cardiovascular: RRR Respiratory: Clear to auscultation bilaterally GI: Abdomen is soft, nontender, nondistended, no abdominal masses GU: Right CVA tendernessus Lymph: No cervical or inguinal  lymphadenopathy. Skin: No rashes, bruises or suspicious lesions. Neurologic: Grossly intact, no focal deficits, moving all 4 extremities. Psychiatric: Normal mood and affect.  Pertinent Imaging: I have personally reviewed the CT urogram dated 07/26/2018.  There is a 4 mm right distal ureteral stone.  This is clearly seen on scout CT.  Anterior skin to stone distance is 9 cm.  800 HU.  Assessment & Plan:   In summary, Mr. Pinzon is a healthy 27 year old male with multiple months of right-sided flank pain, and CT urogram 07/26/2018 with a right 4 mm distal ureteral stone.  He has no fevers or chills or signs of infection.  We discussed various treatment options for urolithiasis including observation with or without medical expulsive therapy, shockwave lithotripsy (SWL), ureteroscopy and laser lithotripsy with stent placement, and percutaneous nephrolithotomy.  We discussed that management is based on stone size, location, density, patient co-morbidities, and patient preference.   Stones <28mm in size have a >80% spontaneous passage rate. Data surrounding the use of tamsulosin for medical expulsive therapy is controversial, but meta analyses suggests it is most efficacious for distal stones between 5-38mm in size. Possible side effects include dizziness/lightheadedness, and retrograde ejaculation.  SWL has a lower stone free rate in a single procedure, but also a lower complication rate compared to ureteroscopy and avoids a stent and associated stent related symptoms. Possible complications include renal hematoma, steinstrasse, and need for additional treatment.  Ureteroscopy with laser lithotripsy and stent placement has a higher stone free rate than SWL in a single procedure, however increased complication rate  including possible infection, ureteral injury, bleeding, and stent related morbidity. Common stent related symptoms include dysuria, urgency/frequency, and flank pain.  After an extensive  discussion of the risks and benefits of the above treatment options, the patient would like to proceed with R SWL.  Proceed with R SWL Thursday 11/6.  Sondra Come, MD  [facility] [address] [place], [place] [address] [phone]

## 2018-07-29 ENCOUNTER — Ambulatory Visit: Payer: BC Managed Care – PPO

## 2018-07-29 ENCOUNTER — Ambulatory Visit
Admission: RE | Admit: 2018-07-29 | Discharge: 2018-07-29 | Disposition: A | Discharge status: Home / Self Care | Payer: BC Managed Care – PPO | Source: Ambulatory Visit | Attending: Urology | Admitting: Urology

## 2018-07-29 ENCOUNTER — Encounter
Admission: RE | Disposition: A | Discharge status: Home / Self Care | Payer: Self-pay | Source: Ambulatory Visit | Attending: Urology

## 2018-07-29 ENCOUNTER — Encounter: Payer: Self-pay | Admitting: *Deleted

## 2018-07-29 DIAGNOSIS — N201 Calculus of ureter: Secondary | ICD-10-CM

## 2018-07-29 HISTORY — DX: Pneumonia, unspecified organism: J18.9

## 2018-07-29 HISTORY — DX: Personal history of urinary calculi: Z87.442

## 2018-07-29 HISTORY — PX: EXTRACORPOREAL SHOCK WAVE LITHOTRIPSY: SHX1557

## 2018-07-29 SURGERY — LITHOTRIPSY, ESWL
Anesthesia: Moderate Sedation | Laterality: Right

## 2018-07-29 MED ORDER — TAMSULOSIN HCL 0.4 MG PO CAPS: 0.4000 mg | ORAL_CAPSULE | Freq: Every day | ORAL | 0 refills | Status: DC

## 2018-07-29 MED ORDER — HYDROCODONE-ACETAMINOPHEN 5-325 MG PO TABS: 1.0000 | ORAL_TABLET | ORAL | 0 refills | Status: DC | PRN

## 2018-07-29 MED ORDER — ONDANSETRON HCL 4 MG/2ML IJ SOLN
4.0000 mg | Freq: Once | INTRAMUSCULAR | Status: AC
  Administered 2018-07-29: 4 mg via INTRAVENOUS

## 2018-07-29 MED ORDER — ONDANSETRON HCL 2 MG/ML IV SOLN: 4.0000 mg | Freq: Once | INTRAVENOUS | Status: DC

## 2018-07-29 MED ORDER — SODIUM CHLORIDE 0.9 % IV SOLN
INTRAVENOUS | Status: DC
  Administered 2018-07-29: 09:00:00 via INTRAVENOUS

## 2018-07-29 MED ORDER — DIAZEPAM 5 MG PO TABS
ORAL_TABLET | ORAL | Status: AC
  Filled 2018-07-29: qty 2

## 2018-07-29 MED ORDER — CIPROFLOXACIN HCL 500 MG PO TABS
ORAL_TABLET | ORAL | Status: AC
  Filled 2018-07-29: qty 1

## 2018-07-29 MED ORDER — KETOROLAC TROMETHAMINE 30 MG/ML IJ SOLN
15.0000 mg | Freq: Once | INTRAMUSCULAR | Status: AC
  Administered 2018-07-29: 15 mg via INTRAVENOUS

## 2018-07-29 MED ORDER — KETOROLAC TROMETHAMINE 30 MG/ML IJ SOLN
INTRAMUSCULAR | Status: AC
  Administered 2018-07-29: 15 mg via INTRAVENOUS
  Filled 2018-07-29: qty 1

## 2018-07-29 MED ORDER — CIPROFLOXACIN HCL 500 MG PO TABS
500.0000 mg | ORAL_TABLET | ORAL | Status: AC
  Administered 2018-07-29: 500 mg via ORAL

## 2018-07-29 MED ORDER — ONDANSETRON HCL 4 MG/2ML IJ SOLN
INTRAMUSCULAR | Status: AC
  Filled 2018-07-29: qty 2

## 2018-07-29 MED ORDER — NAPROXEN 500 MG PO TABS: 500.0000 mg | ORAL_TABLET | Freq: Two times a day (BID) | ORAL | 0 refills | Status: DC | PRN

## 2018-07-29 MED ORDER — DIAZEPAM 5 MG PO TABS
10.0000 mg | ORAL_TABLET | ORAL | Status: AC
  Administered 2018-07-29: 10 mg via ORAL

## 2018-07-29 NOTE — Progress Notes (Signed)
UROLOGY POST OP NOTE  Stone appeared to have very good fragmentation, difficult to visualize on KUB after max of 4000 shocks.   Will follow up in 2 weeks for KUB  Discussed with patient and family.  Legrand Rams, MD 07/29/2018

## 2018-07-29 NOTE — H&P (Signed)
UROLOGY H&P UPDATE  Agree with prior H&P dated 07/28/18  Cardiac: RRR Lungs: CTA bilaterally  Laterality: RIGHT Procedure: RIGHT SWL  Urinalysis: Urinalysis 11/6 with 0-5 WBCs, nitrite negative, bacteria. No symptoms of UTI, no fever. Suspect contaminant.   Right 4mm distal ureteral stone, anterior skin to stone distance is 9 cm.  800 HU.  Informed consent obtained, we specifically discussed the risk of bleeding, infection, steinstrasse, and possibility of residual fragments/incomplete fragmentation requiring additional intervention.  Sondra Come, MD 07/29/2018

## 2018-07-30 ENCOUNTER — Emergency Department
Admission: EM | Admit: 2018-07-30 | Discharge: 2018-07-30 | Disposition: A | Discharge status: Home / Self Care | Payer: BC Managed Care – PPO | Attending: Emergency Medicine | Admitting: Emergency Medicine

## 2018-07-30 ENCOUNTER — Encounter: Payer: Self-pay | Admitting: Emergency Medicine

## 2018-07-30 ENCOUNTER — Other Ambulatory Visit: Payer: Self-pay

## 2018-07-30 ENCOUNTER — Emergency Department: Payer: BC Managed Care – PPO

## 2018-07-30 ENCOUNTER — Telehealth: Payer: Self-pay | Admitting: Urology

## 2018-07-30 DIAGNOSIS — R1111 Vomiting without nausea: Secondary | ICD-10-CM | POA: Diagnosis not present

## 2018-07-30 DIAGNOSIS — K920 Hematemesis: Secondary | ICD-10-CM | POA: Diagnosis present

## 2018-07-30 DIAGNOSIS — N201 Calculus of ureter: Secondary | ICD-10-CM

## 2018-07-30 LAB — CBC
HEMATOCRIT: 34.5 % — AB (ref 39.0–52.0)
HEMOGLOBIN: 12.3 g/dL — AB (ref 13.0–17.0)
MCH: 31.1 pg (ref 26.0–34.0)
MCHC: 35.7 g/dL (ref 30.0–36.0)
MCV: 87.1 fL (ref 80.0–100.0)
Platelets: 174 10*3/uL (ref 150–400)
RBC: 3.96 MIL/uL — ABNORMAL LOW (ref 4.22–5.81)
RDW: 11.6 % (ref 11.5–15.5)
WBC: 9.2 10*3/uL (ref 4.0–10.5)
nRBC: 0 % (ref 0.0–0.2)

## 2018-07-30 LAB — URINALYSIS, COMPLETE (UACMP) WITH MICROSCOPIC
BILIRUBIN URINE: NEGATIVE
Glucose, UA: NEGATIVE mg/dL
KETONES UR: 20 mg/dL — AB
Leukocytes, UA: NEGATIVE
Nitrite: NEGATIVE
PROTEIN: 30 mg/dL — AB
RBC / HPF: >50 RBC/hpf — ABNORMAL HIGH (ref 0–5)
Specific Gravity, Urine: 1.023 (ref 1.005–1.030)
pH: 5 (ref 5.0–8.0)

## 2018-07-30 LAB — COMPREHENSIVE METABOLIC PANEL
ALBUMIN: 4.8 g/dL (ref 3.5–5.0)
ALT: 13 U/L (ref 0–44)
AST: 17 U/L (ref 15–41)
Alkaline Phosphatase: 38 U/L (ref 38–126)
Anion gap: 9 (ref 5–15)
BILIRUBIN TOTAL: 1.4 mg/dL — AB (ref 0.3–1.2)
BUN: 13 mg/dL (ref 6–20)
CHLORIDE: 103 mmol/L (ref 98–111)
CO2: 27 mmol/L (ref 22–32)
CREATININE: 1.26 mg/dL — AB (ref 0.61–1.24)
Calcium: 9 mg/dL (ref 8.9–10.3)
GFR calc Af Amer: >60 mL/min (ref >60)
GLUCOSE: 110 mg/dL — AB (ref 70–99)
POTASSIUM: 3.7 mmol/L (ref 3.5–5.1)
Sodium: 139 mmol/L (ref 135–145)
TOTAL PROTEIN: 7.7 g/dL (ref 6.5–8.1)

## 2018-07-30 LAB — TYPE AND SCREEN
ABO/RH(D): O POS
ANTIBODY SCREEN: NEGATIVE

## 2018-07-30 LAB — LIPASE, BLOOD: Lipase: 26 U/L (ref 11–51)

## 2018-07-30 MED ORDER — ONDANSETRON HCL 4 MG PO TABS: 4.0000 mg | ORAL_TABLET | Freq: Three times a day (TID) | ORAL | 0 refills | Status: DC | PRN

## 2018-07-30 MED ORDER — FENTANYL CITRATE (PF) 100 MCG/2ML IJ SOLN
50.0000 ug | Freq: Once | INTRAMUSCULAR | Status: AC
  Administered 2018-07-30: 50 ug via INTRAVENOUS
  Filled 2018-07-30: qty 2

## 2018-07-30 MED ORDER — KETOROLAC TROMETHAMINE 30 MG/ML IJ SOLN
15.0000 mg | Freq: Once | INTRAMUSCULAR | Status: AC
  Administered 2018-07-30: 15 mg via INTRAVENOUS

## 2018-07-30 MED ORDER — ONDANSETRON HCL 4 MG/2ML IJ SOLN
4.0000 mg | Freq: Once | INTRAMUSCULAR | Status: AC
  Administered 2018-07-30: 4 mg via INTRAVENOUS

## 2018-07-30 MED ORDER — SODIUM CHLORIDE 0.9 % IV BOLUS: 1000.0000 mL | Freq: Once | INTRAVENOUS | Status: DC

## 2018-07-30 MED ORDER — ONDANSETRON HCL 4 MG/2ML IJ SOLN
4.0000 mg | Freq: Once | INTRAMUSCULAR | Status: AC
  Administered 2018-07-30: 4 mg via INTRAVENOUS
  Filled 2018-07-30: qty 2

## 2018-07-30 MED ORDER — KETOROLAC TROMETHAMINE 10 MG PO TABS: 10.0000 mg | ORAL_TABLET | Freq: Four times a day (QID) | ORAL | 0 refills | Status: DC | PRN

## 2018-07-30 NOTE — ED Notes (Signed)
Pt states he had lithotripsy yesterday, a few hours after the procedure pt began having an increase in pain and per pt vomiting blood.

## 2018-07-30 NOTE — ED Notes (Signed)
Urology at bedside.

## 2018-07-30 NOTE — ED Triage Notes (Signed)
Pt to ED via POV, pt states that last night around 7pm  He started vomiting blood. Pt states that he has vomited x 10 and that there has been blood in it every time. Pt states that he is vomiting "a lot of blood". Pt reports that he is having mid to upper abdominal pain. Pt denies hx/o ulcers. Pt denies ETOH abuse. Pt is in NAD at this time.

## 2018-07-30 NOTE — Telephone Encounter (Signed)
Patient called the office this morning after having ESWL on Thursday, 07/29/2018 with Dr. Richardo Hanks.  Patient reports that he has been in severe pain since about 6pm last night.  He states that he has been taking the medication as prescribed, but not able to get relief.    Please advise.  The patient can be reached at [phone].

## 2018-07-30 NOTE — ED Notes (Signed)
FIRST NURSE NOTE:  Pt arrived via POV from [place]. Received call from nurse at [facility] urology office at [phone].  Per nurse, pt had shockwave lithotripsy yesterday and is complaining of pain and vomiting blood, per nurse at the office, Dr. Ronalee Red wants to be notified when pt is at the ED and the patient does not need a CT only a KUB.  Pt would be coming from [place].

## 2018-07-30 NOTE — Telephone Encounter (Signed)
Order received from Dr Richardo Hanks for Toradol 10mg  x 15 tablets every 6 hours as needed for severe pain. If not improved by early afternoon he can call back and we could consider adding him for ureteroscopy at the end of the day. Patient will need to having nothing to eat or drink until it is known if he will need surgery.  Per Dr Richardo Hanks he is likely passing stone fragments. When patient was notified of Dr Keane Scrape instructions patient states he has been vomiting blood since yesterday. Per Dr Richardo Hanks, patient was advised to go to the emergency room. Questions answered & patient expresses understanding of these instructions.

## 2018-07-30 NOTE — Progress Notes (Signed)
UROLOGY PROGRESS NOTE  27 year old healthy male postop day 1 from right shockwave lithotripsy for a 5 mm distal ureteral stone.  Presenting today with right-sided groin pain, nausea, emesis, "hematemesis."  On further questioning, he really has not had bloody emesis, however he had tomato soup for dinner last night and then threw up after taking Norco.  In the ED, he did pass a small stone fragment on his urine sample.  On exam he is resting comfortably in bed in no acute distress.  Vitals are stable.  He is afebrile with normal vital signs.  Labs are reassuring.  Urine without signs of infection.  KUB and ultrasound personally reviewed.  Good fragmentation of right ureteral stone with small fragments at the distal ureter.  Mild hydronephrosis on ultrasound.  We discussed options including intervention with ureteroscopy laser lithotripsy and stent placement versus ongoing medical expulsive therapy.  He is currently very comfortable and amenable to outpatient management.  Recommendations: Continue Flomax Toradol 10 mg every 6 PRN for flank pain Zofran 4 mg as needed for nausea Follow-up in 2 weeks with KUB Call urology clinic if still having flank or groin pain next week  Legrand Rams, MD 07/30/2018

## 2018-07-30 NOTE — ED Provider Notes (Addendum)
[facility] Emergency Department Provider Note  ____________________________________________   I have reviewed the triage vital signs and the nursing notes. Where available I have reviewed prior notes and, if possible and indicated, outside hospital notes.    HISTORY  Chief Complaint Hematemesis    HPI Kevin Benton is a 27 y.o. male who presents today complaining of vomiting after lithotripsy yesterday.  Patient had lithotripsy yesterday evening, around 6 PM he states he began to have vomiting.  There has been some red liquid in the ominous however patient also states that he had a large bowl of tomato soup.  He did show me a picture, now when he throws up it is mostly just clear liquid they trace of red.  He has not had any melena bright red blood per rectum.  He does have ongoing pain in the right side.  He has been taking hydrocodone last taken this morning, not clear if he vomited it.  The pain is on the right flank which radiates around.  He has had no fever or chills.  He was sent him here by urology, who did apparently call and request that we get a KUB   Past Medical History:  Diagnosis Date  . History of kidney stones   . Pneumonia     There are no active problems to display for this patient.   Past Surgical History:  Procedure Laterality Date  . APPENDECTOMY    . EXTRACORPOREAL SHOCK WAVE LITHOTRIPSY Right 07/29/2018   Procedure: EXTRACORPOREAL SHOCK WAVE LITHOTRIPSY (ESWL);  Surgeon: Sondra Come, MD;  Location: [facility] ORS;  Service: Urology;  Laterality: Right;    Prior to Admission medications   Medication Sig Start Date End Date Taking? Authorizing Provider  acetaminophen (TYLENOL) 500 MG tablet Take 500 mg by mouth every 6 (six) hours as needed.    [provider]  HYDROcodone-acetaminophen (NORCO/VICODIN) 5-325 MG tablet Take 1 tablet by mouth every 4 (four) hours as needed for up to 5 days for moderate pain. 07/29/18 08/03/18   Sondra Come, MD  naproxen (NAPROSYN) 500 MG tablet Take 1 tablet (500 mg total) by mouth every 12 (twelve) hours as needed for up to 7 days for moderate pain. 07/29/18 08/05/18  Sondra Come, MD  tamsulosin (FLOMAX) 0.4 MG CAPS capsule Take 1 capsule (0.4 mg total) by mouth daily after supper. 07/29/18   Sondra Come, MD    Allergies Benadryl [diphenhydramine] and Flexeril [cyclobenzaprine]  Family History  Problem Relation Age of Onset  . Dementia Mother   . Leukemia Maternal Grandmother   . Lung cancer Maternal Grandfather   . Leukemia Paternal Grandmother   . Diabetes Paternal Grandfather     Social History Social History   Tobacco Use  . Smoking status: Never Smoker  . Smokeless tobacco: Never Used  Substance Use Topics  . Alcohol use: No    Comment: Rarely  . Drug use: No    Review of Systems Constitutional: No fever/chills Eyes: No visual changes. ENT: No sore throat. No stiff neck no neck pain Cardiovascular: Denies chest pain. Respiratory: Denies shortness of breath. Gastrointestinal:   + vomiting.  No diarrhea.  No constipation. Genitourinary: Negative for dysuria. Musculoskeletal: Negative lower extremity swelling Skin: Negative for rash. Neurological: Negative for severe headaches, focal weakness or numbness.   ____________________________________________   PHYSICAL EXAM:  VITAL SIGNS: ED Triage Vitals  Enc Vitals Group     BP 07/30/18 1018 120/71  Pulse Rate 07/30/18 1018 75     Resp 07/30/18 1018 20     Temp 07/30/18 1018 98.3 F (36.8 C)     Temp Source 07/30/18 1018 Oral     SpO2 07/30/18 1018 100 %     Weight 07/30/18 1018 135 lb (61.2 kg)     Height 07/30/18 1018 5\' 8"  (1.727 m)     Head Circumference --      Peak Flow --      Pain Score 07/30/18 1023 8     Pain Loc --      Pain Edu? --      Excl. in GC? --     Constitutional: Alert and oriented. Well appearing and in no acute distress. Eyes: Conjunctivae are  normal Head: Atraumatic HEENT: No congestion/rhinnorhea. Mucous membranes are moist.  Oropharynx non-erythematous Neck:   Nontender with no meningismus, no masses, no stridor Cardiovascular: Normal rate, regular rhythm. Grossly normal heart sounds.  Good peripheral circulation. Respiratory: Normal respiratory effort.  No retractions. Lungs CTAB. Abdominal: Soft and positive tenderness palpation to the right flank which goes around towards the right upper and right lower quadrant, there is voluntary guarding there is no rebound. No distention.  Back:  There is no focal tenderness or step off.  there is no midline tenderness there are no lesions noted. there is no CVA tenderness Musculoskeletal: No lower extremity tenderness, no upper extremity tenderness. No joint effusions, no DVT signs strong distal pulses no edema Neurologic:  Normal speech and language. No gross focal neurologic deficits are appreciated.  Skin:  Skin is warm, dry and intact. No rash noted. Psychiatric: Mood and affect are normal. Speech and behavior are normal.  ____________________________________________   LABS (all labs ordered are listed, but only abnormal results are displayed)  Labs Reviewed  COMPREHENSIVE METABOLIC PANEL - Abnormal; Notable for the following components:      Result Value   Glucose, Bld 110 (*)    Creatinine, Ser 1.26 (*)    Total Bilirubin 1.4 (*)    All other components within normal limits  CBC - Abnormal; Notable for the following components:   RBC 3.96 (*)    Hemoglobin 12.3 (*)    HCT 34.5 (*)    All other components within normal limits  URINALYSIS, COMPLETE (UACMP) WITH MICROSCOPIC - Abnormal; Notable for the following components:   Color, Urine YELLOW (*)    APPearance HAZY (*)    Hgb urine dipstick LARGE (*)    Ketones, ur 20 (*)    Protein, ur 30 (*)    RBC / HPF >50 (*)    Bacteria, UA RARE (*)    All other components within normal limits  LIPASE, BLOOD  TYPE AND SCREEN     Pertinent labs  results that were available during my care of the patient were reviewed by me and considered in my medical decision making (see chart for details). ____________________________________________  EKG  I personally interpreted any EKGs ordered by me or triage  ____________________________________________  RADIOLOGY  Pertinent labs & imaging results that were available during my care of the patient were reviewed by me and considered in my medical decision making (see chart for details). If possible, patient and/or family made aware of any abnormal findings.  No results found. ____________________________________________    PROCEDURES  Procedure(s) performed: None  Procedures  Critical Care performed: None  ____________________________________________   INITIAL IMPRESSION / ASSESSMENT AND PLAN / ED COURSE  Pertinent labs & imaging  results that were available during my care of the patient were reviewed by me and considered in my medical decision making (see chart for details).  Here with persistent vomiting after lithotripsy yesterday, he does state that there may have been blood in there, however that is cleared and he had tomato soup at the beginning of his vomiting episodes so not clear if he has a Mallory-Weiss tear or he has predominantly a tomato soup gradient of his vomiting.    ----------------------------------------- 1:17 PM on 07/30/2018 -----------------------------------------  D/w dr. Michaelle Birks, who asked me to get a right-sided renal ultrasound does not want a CT scan, he will come evaluate his patient personally in the ER after that.  ----------------------------------------- 3:21 PM on 07/30/2018 -----------------------------------------  Is comfortable has not vomited here does not wish more pain medication we are waiting for urologic surgery to come evaluate  ----------------------------------------- 4:36 PM on  07/30/2018 -----------------------------------------  Was seen and evaluated by urology, they wish the patient to have Toradol at home, there is no convincing evidence of an active GI bleed at this time.  We did discuss it.  Allergy would also like me to write him a prescription for Zofran which I have done as well.  Patient tolerating p.o. here has not vomited in several hours while being here, and is well-appearing, after being personally evaluated by urology they do not wish further imaging.  Extensive return precaution follow-up given and understood.    ____________________________________________   FINAL CLINICAL IMPRESSION(S) / ED DIAGNOSES  Final diagnoses:  None      This chart was dictated using voice recognition software.  Despite best efforts to proofread,  errors can occur which can change meaning.      Jeanmarie Plant, MD 07/30/18 1318    Jeanmarie Plant, MD 07/30/18 1521    Jeanmarie Plant, MD 07/30/18 [phone]

## 2018-07-30 NOTE — Discharge Instructions (Signed)
If you have black stool, any bleeding, fever, persistent vomiting or fever, or you feel worse in any way, return to the emergency room.  Follow closely with your primary care doctor and the urologist.

## 2018-07-30 NOTE — Telephone Encounter (Signed)
Patient states he is having more severe pain than before the procedure. He denies fever but hasn't checked temperature with a thermometer. He also reports nausea without vomiting.  Awaiting recommendation from Dr Richardo Hanks.

## 2018-07-30 NOTE — ED Notes (Signed)
Pt ambulated to toilet in room with a steady gait 

## 2018-08-03 ENCOUNTER — Telehealth: Payer: Self-pay | Admitting: Family Medicine

## 2018-08-03 ENCOUNTER — Telehealth: Payer: Self-pay

## 2018-08-03 ENCOUNTER — Telehealth: Payer: Self-pay | Admitting: Urology

## 2018-08-03 NOTE — Telephone Encounter (Signed)
Patient states he is still taking the Toradol but he has stopped the Zofran because it was causing severe headaches. Patient still complains of pain. He states he has not passed anything and is interested to know if he should be set up for surgery. Please advise

## 2018-08-03 NOTE — Telephone Encounter (Signed)
See telephone encounter for 08/03/2018.

## 2018-08-03 NOTE — Telephone Encounter (Signed)
Pt states he had Litho a couple weeks ago and has been in and out of ER since. Pt states he still hasn't passed any stones, was advised to call the office. Pt wants to speak to someone about his concerns and questions. Please advise at [phone].

## 2018-08-04 ENCOUNTER — Encounter: Payer: Self-pay | Admitting: Radiology

## 2018-08-04 ENCOUNTER — Other Ambulatory Visit: Payer: Self-pay | Admitting: Radiology

## 2018-08-04 DIAGNOSIS — N201 Calculus of ureter: Secondary | ICD-10-CM

## 2018-08-04 MED ORDER — KETOROLAC TROMETHAMINE 10 MG PO TABS: 10.0000 mg | ORAL_TABLET | Freq: Four times a day (QID) | ORAL | 0 refills | Status: DC | PRN

## 2018-08-04 NOTE — Telephone Encounter (Signed)
Per Dr Richardo HanksSninsky, patient was informed that a script for Toradol will be sent to his pharmacy & that we can schedule ureteroscopy on 08/06/2018 & get a KUB day of surgery to confirm presence of stone fragments. If no fragments are present surgery would be cancelled. Patient states he would not like surgery at this time & prefers to keep appointment with KUB on 08/11/2018. Encouraged patient to increase fluid intake & call back if pain becomes intolerable or temperature >101.5. Questions answered. Patient expresses understanding of conversation.

## 2018-08-04 NOTE — Telephone Encounter (Signed)
Please call and see if he is still having stone pain and/or nausea.   If he is, please add him on for a one hour right urs/laser lithotripsy/stent with me this Friday 11/15 at the end of the day. He is a healthy young man, does not need any clearance before surgery. Cefazolin pre-op.  He should get a kub the day of surgery in pre-op to confirm residual fragments.  If his pain is resolved/improving, he should keep his scheduled follow up for KUB in ~10 days.  Thanks Legrand RamsBrian Takenya Travaglini, MD 08/04/2018

## 2018-08-04 NOTE — Telephone Encounter (Signed)
LMOM to return call.

## 2018-08-04 NOTE — Telephone Encounter (Signed)
Patient states his pain was improving while taking Toradol but he has run out of medication. He attempted to go to work today but pain was too severe to stay. He has passed at least one stone fragment & is having mild hematuria. Advised patient that bleeding is normal to have some bleeding post ESWL.  Dr Richardo HanksSninsky, please advise on URS vs KUB in ~10 days.

## 2018-08-05 ENCOUNTER — Other Ambulatory Visit: Payer: BC Managed Care – PPO | Admitting: Urology

## 2018-08-11 ENCOUNTER — Ambulatory Visit (INDEPENDENT_AMBULATORY_CARE_PROVIDER_SITE_OTHER): Payer: BC Managed Care – PPO | Admitting: Urology

## 2018-08-11 ENCOUNTER — Ambulatory Visit
Admission: RE | Admit: 2018-08-11 | Discharge: 2018-08-11 | Disposition: A | Discharge status: Home / Self Care | Payer: BC Managed Care – PPO | Source: Ambulatory Visit | Attending: Urology | Admitting: Urology

## 2018-08-11 ENCOUNTER — Encounter: Payer: Self-pay | Admitting: Urology

## 2018-08-11 VITALS — BP 134/75 | HR 70 | Ht 68.0 in | Wt 141.0 lb

## 2018-08-11 DIAGNOSIS — N201 Calculus of ureter: Secondary | ICD-10-CM

## 2018-08-11 DIAGNOSIS — N200 Calculus of kidney: Secondary | ICD-10-CM

## 2018-08-11 DIAGNOSIS — N3000 Acute cystitis without hematuria: Secondary | ICD-10-CM | POA: Diagnosis not present

## 2018-08-11 LAB — MICROSCOPIC EXAMINATION
EPITHELIAL CELLS (NON RENAL): NONE SEEN /HPF (ref 0–10)
RBC MICROSCOPIC, UA: NONE SEEN /HPF (ref 0–2)
WBC UA: NONE SEEN /HPF (ref 0–5)

## 2018-08-11 LAB — URINALYSIS, COMPLETE
Bilirubin, UA: NEGATIVE
GLUCOSE, UA: NEGATIVE
Ketones, UA: NEGATIVE
Leukocytes, UA: NEGATIVE
NITRITE UA: NEGATIVE
Protein, UA: NEGATIVE
RBC, UA: NEGATIVE
Specific Gravity, UA: 1.025 (ref 1.005–1.030)
UUROB: 0.2 mg/dL (ref 0.2–1.0)
pH, UA: 6.5 (ref 5.0–7.5)

## 2018-08-11 NOTE — Progress Notes (Signed)
   08/11/2018 9:29 AM   Peyton NajjarAnthony W Sisneros 12/05/1990 [id number]  Reason for visit: Follow up R SWL 07/29/2018  HPI: I saw Mr. Valentina LucksStout back in urology clinic after undergoing right shockwave lithotripsy on 07/29/2018 for 4 mm distal ureteral stone.  Notably, he did have an ER visit postop day 1 for right-sided pain with a KUB demonstrating some residual 1 mm fragments lodged at the right UVJ.  He was treated with pain control and Flomax.  His pain has since completely resolved, and KUB today shows no residual stone fragments.  He is not taking any medications at this time.  He does report a mild rash on his left lower extremity that he relates to Flomax.  He denies any fevers, chills, nausea, vomiting, hematuria, or flank pain.  He was not able to catch any fragments to send for analysis.  This was his first stone episode.   ROS: Please see flowsheet from today's date for complete review of systems.  Physical Exam: BP 134/75   Pulse 70   Ht 5\' 8"  (1.727 m)   Wt 141 lb (64 kg)   BMI 21.44 kg/m    Constitutional:  Alert and oriented, No acute distress. Respiratory: Normal respiratory effort, no increased work of breathing. GI: Abdomen is soft, nontender, nondistended, no abdominal masses GU: No CVA tenderness Skin: No rashes, bruises or suspicious lesions. Neurologic: Grossly intact, no focal deficits, moving all 4 extremities. Psychiatric: Normal mood and affect  Pertinent Imaging:  I have personally reviewed the KUB today, no residual fragments overlying the right pelvis.  Assessment & Plan:   In summary, Mr. Valentina LucksStout is a healthy 27 year old male status post successful right shockwave lithotripsy for a 4 mm distal ureteral stone.  His pain is completely resolved, and there are no residual fragments on KUB today.  We discussed general stone prevention strategies including adequate hydration with goal of producing 2.5 L of urine daily, increasing citric acid intake, increasing calcium  intake during high oxalate meals, minimizing animal protein, and decreasing salt intake. Information about dietary recommendations given today.   Return in about 1 year (around 08/12/2019) for kub.   10 minutes were spent with the patient today, greater than 50% were spent in direct face-to-face patient education and counseling regarding postop expectations from shockwave lithotripsy, and stone prevention.  Sondra ComeBrian C Truman Aceituno, MD  [facility] [address], [address] [place], [place] [id number] [phone]

## 2019-08-15 ENCOUNTER — Encounter: Payer: Self-pay | Admitting: Urology

## 2019-08-15 ENCOUNTER — Ambulatory Visit (INDEPENDENT_AMBULATORY_CARE_PROVIDER_SITE_OTHER): Payer: BC Managed Care – PPO | Admitting: Urology

## 2019-08-15 ENCOUNTER — Ambulatory Visit: Payer: BC Managed Care – PPO | Admitting: Urology

## 2019-08-15 ENCOUNTER — Ambulatory Visit
Admission: RE | Admit: 2019-08-15 | Discharge: 2019-08-15 | Disposition: A | Discharge status: Home / Self Care | Payer: BC Managed Care – PPO | Source: Ambulatory Visit | Attending: Urology | Admitting: Urology

## 2019-08-15 ENCOUNTER — Other Ambulatory Visit: Payer: Self-pay

## 2019-08-15 VITALS — BP 132/63 | HR 60 | Ht 68.0 in | Wt 141.8 lb

## 2019-08-15 DIAGNOSIS — Z87442 Personal history of urinary calculi: Secondary | ICD-10-CM | POA: Diagnosis not present

## 2019-08-15 DIAGNOSIS — R350 Frequency of micturition: Secondary | ICD-10-CM | POA: Diagnosis not present

## 2019-08-15 DIAGNOSIS — R1031 Right lower quadrant pain: Secondary | ICD-10-CM

## 2019-08-15 DIAGNOSIS — N200 Calculus of kidney: Secondary | ICD-10-CM | POA: Insufficient documentation

## 2019-08-15 LAB — BLADDER SCAN AMB NON-IMAGING

## 2019-08-15 NOTE — Progress Notes (Signed)
   08/15/2019 6:19 PM   Penni Bombard [birth year] [id number]  Reason for visit: History of nephrolithiasis  HPI: I saw Mr. Santillana in urology clinic today for routine follow-up of nephrolithiasis.  He is a healthy 28 year old male who underwent uncomplicated shockwave lithotripsy on 07/29/2018 for a right 4 mm distal ureteral stone.  He had a small obstructive fragment postop day 1 that resulted in an ER visit, but he passed this spontaneously a few hours later.  A follow-up KUB on 08/11/2018 showed no residual fragments.  He reports 3 to 4 months of intermittent right groin pain that he attributes is similar to his prior stone episode.  He has not called the clinic or presented to the ED with the symptoms.  He also reports intermittent pink urine every few weeks.  He also reports urinating " 60 times" during the day, and "9-10 times" overnight.  He has never mentioned these symptoms to me before, however he reports they have been present since he underwent an appendectomy 5 or 6 years ago.  He does drink some coffee in the morning, but otherwise denies any other diet soda, tea, or significant alcohol use.  He denies any dysuria or fevers.  Urinalysis today is completely negative with 0 WBCs, 0 RBCs, no bacteria, no yeast, no trichomonas, nitrite negative.  He reports STI testing has always been negative in the past and he has no new partners or risky behavior.  PVR 0 mL.   ROS: Please see flowsheet from today's date for complete review of systems.  Physical Exam: BP 132/63 (BP Location: Left Arm, Patient Position: Sitting, Cuff Size: Normal)   Pulse 60   Ht 5\' 8"  (1.727 m)   Wt 141 lb 12.8 oz (64.3 kg)   BMI 21.56 kg/m    I personally reviewed his KUB today.  There is no evidence of nephrolithiasis or residual fragments.  I long conversation with the patient today about his symptoms.  His benign urinalysis and PVR of 0 mL are both reassuring.  However, the correlation of his symptoms  after an appendectomy, is worrisome for possible urethral stricture.  He also would like to pursue a CT to rule out a kidney stone with his ongoing pain, despite his negative KUB and benign urinalysis.  We will call with CT results If CT negative, consider cystoscopy to rule out urethral stricture  A total of 15 minutes were spent face-to-face with the patient, greater than 50% was spent in patient education, counseling, and coordination of care regarding nephrolithiasis, flank pain, and urinary symptoms.  Billey Co, [facility] [address], [place], [place] [address] [phone]

## 2019-08-15 NOTE — Patient Instructions (Signed)
Will call with urine results °

## 2019-08-16 ENCOUNTER — Ambulatory Visit: Payer: BC Managed Care – PPO | Admitting: Urology

## 2019-08-16 ENCOUNTER — Telehealth: Payer: Self-pay

## 2019-08-16 LAB — URINALYSIS, COMPLETE
Bilirubin, UA: NEGATIVE
Glucose, UA: NEGATIVE
Ketones, UA: NEGATIVE
Leukocytes,UA: NEGATIVE
Nitrite, UA: NEGATIVE
Protein,UA: NEGATIVE
RBC, UA: NEGATIVE
Specific Gravity, UA: 1.015 (ref 1.005–1.030)
Urobilinogen, Ur: 0.2 mg/dL (ref 0.2–1.0)
pH, UA: 5.5 (ref 5.0–7.5)

## 2019-08-16 LAB — MICROSCOPIC EXAMINATION
Bacteria, UA: NONE SEEN
Epithelial Cells (non renal): NONE SEEN /hpf (ref 0–10)
RBC: NONE SEEN /hpf (ref 0–2)
WBC, UA: NONE SEEN /hpf (ref 0–5)

## 2019-08-16 NOTE — Telephone Encounter (Signed)
Called patient no answer no vmail set up 

## 2019-08-16 NOTE — Telephone Encounter (Signed)
-----   Message from Billey Co, MD sent at 08/16/2019 10:29 AM EST ----- Urine is completely negative, no blood or infection, we will call with the CT results  Nickolas Madrid, MD 08/16/2019

## 2019-08-16 NOTE — Telephone Encounter (Signed)
Pt called and I read him his UA results, he voiced understanding,.

## 2019-08-18 ENCOUNTER — Emergency Department: Payer: BC Managed Care – PPO

## 2019-08-18 ENCOUNTER — Other Ambulatory Visit: Payer: Self-pay

## 2019-08-18 ENCOUNTER — Encounter: Payer: Self-pay | Admitting: Emergency Medicine

## 2019-08-18 ENCOUNTER — Emergency Department
Admission: EM | Admit: 2019-08-18 | Discharge: 2019-08-18 | Disposition: A | Discharge status: Home / Self Care | Payer: BC Managed Care – PPO | Attending: Student | Admitting: Student

## 2019-08-18 DIAGNOSIS — R109 Unspecified abdominal pain: Secondary | ICD-10-CM | POA: Insufficient documentation

## 2019-08-18 DIAGNOSIS — R1031 Right lower quadrant pain: Secondary | ICD-10-CM

## 2019-08-18 DIAGNOSIS — Z87442 Personal history of urinary calculi: Secondary | ICD-10-CM

## 2019-08-18 LAB — CBC
HCT: 36.6 % — ABNORMAL LOW (ref 39.0–52.0)
Hemoglobin: 13 g/dL (ref 13.0–17.0)
MCH: 30.8 pg (ref 26.0–34.0)
MCHC: 35.5 g/dL (ref 30.0–36.0)
MCV: 86.7 fL (ref 80.0–100.0)
Platelets: 155 10*3/uL (ref 150–400)
RBC: 4.22 MIL/uL (ref 4.22–5.81)
RDW: 11.7 % (ref 11.5–15.5)
WBC: 3.8 10*3/uL — ABNORMAL LOW (ref 4.0–10.5)
nRBC: 0 % (ref 0.0–0.2)

## 2019-08-18 LAB — URINALYSIS, COMPLETE (UACMP) WITH MICROSCOPIC
Bacteria, UA: NONE SEEN
Bilirubin Urine: NEGATIVE
Glucose, UA: NEGATIVE mg/dL
Hgb urine dipstick: NEGATIVE
Ketones, ur: 20 mg/dL — AB
Leukocytes,Ua: NEGATIVE
Nitrite: NEGATIVE
Protein, ur: NEGATIVE mg/dL
Specific Gravity, Urine: 1.026 (ref 1.005–1.030)
Squamous Epithelial / LPF: NONE SEEN (ref 0–5)
pH: 5 (ref 5.0–8.0)

## 2019-08-18 LAB — BASIC METABOLIC PANEL
Anion gap: 12 (ref 5–15)
BUN: 18 mg/dL (ref 6–20)
CO2: 25 mmol/L (ref 22–32)
Calcium: 8.8 mg/dL — ABNORMAL LOW (ref 8.9–10.3)
Chloride: 99 mmol/L (ref 98–111)
Creatinine, Ser: 1.1 mg/dL (ref 0.61–1.24)
GFR calc Af Amer: >60 mL/min (ref >60)
GFR calc non Af Amer: >60 mL/min (ref >60)
Glucose, Bld: 93 mg/dL (ref 70–99)
Potassium: 3.8 mmol/L (ref 3.5–5.1)
Sodium: 136 mmol/L (ref 135–145)

## 2019-08-18 MED ORDER — ONDANSETRON 4 MG PO TBDP
4.0000 mg | ORAL_TABLET | Freq: Once | ORAL | Status: AC
  Administered 2019-08-18: 4 mg via ORAL
  Filled 2019-08-18: qty 1

## 2019-08-18 MED ORDER — IBUPROFEN 600 MG PO TABS: 600.0000 mg | ORAL_TABLET | Freq: Four times a day (QID) | ORAL | 0 refills | Status: AC | PRN

## 2019-08-18 MED ORDER — ONDANSETRON HCL 4 MG PO TABS: 4.0000 mg | ORAL_TABLET | Freq: Three times a day (TID) | ORAL | 0 refills | Status: AC | PRN

## 2019-08-18 MED ORDER — KETOROLAC TROMETHAMINE 30 MG/ML IJ SOLN
30.0000 mg | Freq: Once | INTRAMUSCULAR | Status: AC
  Administered 2019-08-18: 30 mg via INTRAMUSCULAR
  Filled 2019-08-18: qty 1

## 2019-08-18 MED ORDER — OXYCODONE-ACETAMINOPHEN 5-325 MG PO TABS
1.0000 | ORAL_TABLET | Freq: Once | ORAL | Status: AC
  Administered 2019-08-18: 1 via ORAL
  Filled 2019-08-18: qty 1

## 2019-08-18 NOTE — ED Provider Notes (Signed)
[facility] Emergency Department Provider Note  ____________________________________________   First MD Initiated Contact with Patient 08/18/19 1510     (approximate)  I have reviewed the triage vital signs and the nursing notes.  History  Chief Complaint Flank Pain    HPI Kevin Benton is a 28 y.o. male with history of nephrolithiasis, previously requiring lithotripsy, who presents to the emergency department for right flank pain.  Patient states his pain feels similar to episode of nephrolithiasis, which is what prompted him to seek care.  Symptoms started several days ago.  He reports constant right flank discomfort described as aching, moderate in severity, with intermittent episodes of more severe, sharp pain located in the flank, and radiating down towards his groin area.  No alleviating or aggravating factors.  This has been associated with one episode of nausea and vomiting.  One loose stool.  No fevers.  He does report seeing blood in his urine, denies any dysuria.  Denies any sick contacts.  He is status post appendectomy.   Past Medical Hx Past Medical History:  Diagnosis Date  . History of kidney stones   . Kidney stone   . Pneumonia     Problem List There are no active problems to display for this patient.   Past Surgical Hx Past Surgical History:  Procedure Laterality Date  . APPENDECTOMY    . EXTRACORPOREAL SHOCK WAVE LITHOTRIPSY Right 07/29/2018   Procedure: EXTRACORPOREAL SHOCK WAVE LITHOTRIPSY (ESWL);  Surgeon: Billey Co, MD;  Location: [facility] ORS;  Service: Urology;  Laterality: Right;    Medications Prior to Admission medications   Not on File    Allergies Benadryl [diphenhydramine] and Flexeril [cyclobenzaprine]  Family Hx Family History  Problem Relation Age of Onset  . Dementia Mother   . Leukemia Maternal Grandmother   . Lung cancer Maternal Grandfather   . Leukemia Paternal Grandmother   . Diabetes  Paternal Grandfather     Social Hx Social History   Tobacco Use  . Smoking status: Never Smoker  . Smokeless tobacco: Never Used  Substance Use Topics  . Alcohol use: No    Comment: Rarely  . Drug use: No     Review of Systems  Constitutional: Negative for fever, chills. Eyes: Negative for visual changes. ENT: Negative for sore throat. Cardiovascular: Negative for chest pain. Respiratory: Negative for shortness of breath. Gastrointestinal: Negative for nausea, vomiting.  Genitourinary: + R flank pain, hematuria Musculoskeletal: Negative for leg swelling. Skin: Negative for rash. Neurological: Negative for for headaches.   Physical Exam  Vital Signs: ED Triage Vitals  Enc Vitals Group     BP 08/18/19 1419 120/83     Pulse Rate 08/18/19 1419 94     Resp 08/18/19 1419 20     Temp 08/18/19 1419 98.9 F (37.2 C)     Temp Source 08/18/19 1419 Oral     SpO2 08/18/19 1419 97 %     Weight 08/18/19 1421 135 lb (61.2 kg)     Height 08/18/19 1421 5\' 8"  (1.727 m)     Head Circumference --      Peak Flow --      Pain Score 08/18/19 1421 7     Pain Loc --      Pain Edu? --      Excl. in [place]? --     Constitutional: Alert and oriented.  Head: Normocephalic. Atraumatic. Eyes: Conjunctivae clear. Sclera anicteric. Nose: No congestion. No rhinorrhea. Mouth/Throat: Wearing mask.  Neck: No stridor.   Cardiovascular: Normal rate, regular rhythm. Extremities well perfused. Respiratory: Normal respiratory effort.  Lungs CTAB. Gastrointestinal: Soft. Non-tender throughout to deep palpation. Non-distended.  Musculoskeletal: No lower extremity edema. No deformities. Neurologic:  Normal speech and language. No gross focal neurologic deficits are appreciated.  Skin: Skin is warm, dry and intact. No rash noted. Psychiatric: Mood and affect are appropriate for situation.  EKG  N/A    Radiology  CT: IMPRESSION:  Two punctate non-obstructing calculi within the right renal   collecting system, one interpolar and the other within the lower  pole.   Mild nonspecific asymmetric prominence of the right ureter without  ureteral calculus identified.    Procedures  Procedure(s) performed (including critical care):  Procedures   Initial Impression / Assessment and Plan / ED Course  28 y.o. male who presents to the ED for right flank pain, similar to prior episodes of nephrolithiasis  Ddx: nephrolithiasis/ureterolithiasis, MSK, UTI.  He is status post appendectomy.  Urine without evidence of infection.  CT scan reveals 2 nonobstructing calculi in the right sided system, as well as mild nonspecific asymmetric prominence of the right ureter without a calculus identified.  Perhaps this represents a recently passed stone.  Patient reports feeling improved after medication and has tolerated PO in the emergency department.  As such, given his negative work-up, will plan for discharge home with supportive care, advised PCP follow-up and given return precautions.  He voices understanding and is comfortable to plan and discharge.   Final Clinical Impression(s) / ED Diagnosis  Final diagnoses:  Right flank pain       Note:  This document was prepared using Dragon voice recognition software and may include unintentional dictation errors.   Miguel Aschoff., MD 08/18/19 [phone]

## 2019-08-18 NOTE — Discharge Instructions (Addendum)
Thank you for letting us take care of you in the emergency department today.   Please continue to take any regular, prescribed medications.   New medications we have prescribed:  - Zofran - as needed for nausea - Ibuprofen - as needed for pain  Please follow up with: - Your primary care doctor to review your ER visit and follow up on your symptoms.   Please return to the ER for any new or worsening symptoms.

## 2019-08-18 NOTE — ED Triage Notes (Signed)
Pain R flank since yesterday. history of kidney stones.

## 2019-08-18 NOTE — ED Notes (Signed)
Pt to CT

## 2019-08-30 ENCOUNTER — Ambulatory Visit: Payer: BC Managed Care – PPO | Admitting: Urology

## 2020-10-10 ENCOUNTER — Ambulatory Visit: Payer: Self-pay

## 2020-10-10 NOTE — Telephone Encounter (Signed)
Patient called and says while working he climbed on a ladder and noticed tingling to his right foot up his leg. He says he initially thought he was bit by something, so he took off his shoes and socks. He noticed a red area to the right heel and some swelling. He says it happened about 45 minutes before calling and now the tingling/numbness he felt is not as bad as it was initially, the swelling has gone down to the bottom of the foot. He says the red area is still there and he still feels the tingling/numbness to the bottom of the right foot up his calf when walking. I advised no in office appointments with PCP until Monday. He says he doesn't ant to see anyone else, so he will just wait to see Adriana. Care advice given, patient verbalized understanding.  Reason for Disposition . [1] Small area of LOCALIZED swelling AND [2] itchy  Answer Assessment - Initial Assessment Questions 1. SYMPTOM: "What is the main symptom you are concerned about?" (e.g., weakness, numbness)     Tingling in right foot and right leg 2. ONSET: "When did this start?" (minutes, hours, days; while sleeping)     45 minutes ago 3. LAST NORMAL: "When was the last time you were normal (no symptoms)?"     45 minutes ago 4. PATTERN "Does this come and go, or has it been constant since it started?"  "Is it present now?"     Not present now 5. CARDIAC SYMPTOMS: "Have you had any of the following symptoms: chest pain, difficulty breathing, palpitations?"     No 6. NEUROLOGIC SYMPTOMS: "Have you had any of the following symptoms: headache, dizziness, vision loss, double vision, changes in speech, unsteady on your feet?"     No 7. OTHER SYMPTOMS: "Do you have any other symptoms?"     Right heel is red 8. PREGNANCY: "Is there any chance you are pregnant?" "When was your last menstrual period?"     N/A  Answer Assessment - Initial Assessment Questions 1. ONSET: "When did the swelling start?" (e.g., minutes, hours, days)     About  45 minutes ago 2. LOCATION: "What part of the leg is swollen?"  "Are both legs swollen or just one leg?"     Right foot at the heel 3. SEVERITY: "How bad is the swelling?" (e.g., localized; mild, moderate, severe)  - Localized - small area of swelling localized to one leg  - MILD pedal edema - swelling limited to foot and ankle, pitting edema < 1/4 inch (6 mm) deep, rest and elevation eliminate most or all swelling  - MODERATE edema - swelling of lower leg to knee, pitting edema > 1/4 inch (6 mm) deep, rest and elevation only partially reduce swelling  - SEVERE edema - swelling extends above knee, facial or hand swelling present      Localized to the heel 4. REDNESS: "Does the swelling look red or infected?"     Right heel has a red area 5. PAIN: "Is the swelling painful to touch?" If Yes, ask: "How painful is it?"   (Scale 1-10; mild, moderate or severe)     No 6. FEVER: "Do you have a fever?" If Yes, ask: "What is it, how was it measured, and when did it start?"      No 7. CAUSE: "What do you think is causing the leg swelling?"     I don't know 8. MEDICAL HISTORY: "Do you have a history of heart  failure, kidney disease, liver failure, or cancer?"     N/A 9. RECURRENT SYMPTOM: "Have you had leg swelling before?" If Yes, ask: "When was the last time?" "What happened that time?"     N/A 10. OTHER SYMPTOMS: "Do you have any other symptoms?" (e.g., chest pain, difficulty breathing)       Slight tingling to the foot and leg when walking 11. PREGNANCY: "Is there any chance you are pregnant?" "When was your last menstrual period?"       N/A  Protocols used: LEG SWELLING AND EDEMA-A-AH, NEUROLOGIC DEFICIT-A-AH

## 2020-10-15 ENCOUNTER — Encounter: Payer: Self-pay | Admitting: Adult Health

## 2020-10-15 ENCOUNTER — Ambulatory Visit (INDEPENDENT_AMBULATORY_CARE_PROVIDER_SITE_OTHER): Payer: BC Managed Care – PPO | Admitting: Adult Health

## 2020-10-15 ENCOUNTER — Ambulatory Visit: Payer: BC Managed Care – PPO | Admitting: Physician Assistant

## 2020-10-15 ENCOUNTER — Other Ambulatory Visit: Payer: Self-pay

## 2020-10-15 VITALS — BP 102/67 | HR 82 | Temp 97.7°F | Resp 16 | Wt 144.0 lb

## 2020-10-15 DIAGNOSIS — L299 Pruritus, unspecified: Secondary | ICD-10-CM | POA: Diagnosis not present

## 2020-10-15 DIAGNOSIS — B369 Superficial mycosis, unspecified: Secondary | ICD-10-CM

## 2020-10-15 MED ORDER — HYDROXYZINE HCL 10 MG PO TABS: 10.0000 mg | ORAL_TABLET | Freq: Three times a day (TID) | ORAL | 0 refills | Status: DC | PRN

## 2020-10-15 MED ORDER — KETOCONAZOLE 2 % EX CREA: 1.0000 "application " | TOPICAL_CREAM | Freq: Every day | CUTANEOUS | 0 refills | Status: DC

## 2020-10-15 NOTE — Progress Notes (Signed)
Established patient visit   Patient: Kevin Benton   DOB: August 31, 1991   30 y.o. Male  MRN: [id number] Visit Date: 10/15/2020  Today's healthcare provider: Jairo Ben, FNP   Chief Complaint  Patient presents with  . Foot Pain   Subjective    Foot Injury  There was no injury mechanism. Pain location: right heel. The pain is moderate. Pertinent negatives include no inability to bear weight, loss of motion, loss of sensation, muscle weakness, numbness or tingling. He reports no foreign bodies present. The symptoms are aggravated by weight bearing.  Rash This is a new problem. The current episode started more than 1 month ago. The problem is unchanged. The affected locations include the left lower leg, right lower leg and torso. The rash is characterized by itchiness. He was exposed to nothing.    Patient reports that his foot pain has totally resolved.  He denies any symptoms for this today.  Patient  denies any fever, body aches,chills, chest pain, shortness of breath, nausea, vomiting, or diarrhea.  Denies dizziness, lightheadedness, pre syncopal or syncopal episodes.   There are no problems to display for this patient.  Past Medical History:  Diagnosis Date  . History of kidney stones   . Kidney stone   . Pneumonia    Allergies  Allergen Reactions  . Diphenhydramine Hives  . Cyclobenzaprine Itching    Itching & burning Itching & burning       Medications: No outpatient medications prior to visit.   No facility-administered medications prior to visit.    Review of Systems  Skin: Positive for rash.  Neurological: Negative for tingling and numbness.    Last CBC Lab Results  Component Value Date   WBC 3.8 (L) 08/18/2019   HGB 13.0 08/18/2019   HCT 36.6 (L) 08/18/2019   MCV 86.7 08/18/2019   MCH 30.8 08/18/2019   RDW 11.7 08/18/2019   PLT 155 08/18/2019   Last metabolic panel Lab Results  Component Value Date   GLUCOSE 93 08/18/2019    NA 136 08/18/2019   K 3.8 08/18/2019   CL 99 08/18/2019   CO2 25 08/18/2019   BUN 18 08/18/2019   CREATININE 1.10 08/18/2019   GFRNONAA >60 08/18/2019   GFRAA >60 08/18/2019   CALCIUM 8.8 (L) 08/18/2019   PROT 7.7 07/30/2018   ALBUMIN 4.8 07/30/2018   BILITOT 1.4 (H) 07/30/2018   ALKPHOS 38 07/30/2018   AST 17 07/30/2018   ALT 13 07/30/2018   ANIONGAP 12 08/18/2019       Objective    BP 102/67   Pulse 82   Temp 97.7 F (36.5 C) (Oral)   Resp 16   Wt 144 lb (65.3 kg)   SpO2 100%   BMI 21.90 kg/m  BP Readings from Last 3 Encounters:  10/15/20 102/67  08/18/19 109/75  08/15/19 132/63   Wt Readings from Last 3 Encounters:  10/15/20 144 lb (65.3 kg)  08/18/19 135 lb (61.2 kg)  08/15/19 141 lb 12.8 oz (64.3 kg)       Physical Exam Vitals reviewed.  Constitutional:      General: He is not in acute distress.    Appearance: Normal appearance. He is not ill-appearing, toxic-appearing or diaphoretic.  HENT:     Head: Normocephalic and atraumatic.  Cardiovascular:     Rate and Rhythm: Normal rate and regular rhythm.     Pulses: Normal pulses.     Heart sounds: Normal heart sounds.  No murmur heard. No friction rub. No gallop.   Pulmonary:     Effort: Pulmonary effort is normal. No respiratory distress.     Breath sounds: Normal breath sounds. No stridor. No wheezing, rhonchi or rales.  Chest:     Chest wall: No tenderness.  Abdominal:     General: There is no distension.     Palpations: Abdomen is soft.     Tenderness: There is no abdominal tenderness.  Musculoskeletal:        General: No swelling, tenderness, deformity or signs of injury. Normal range of motion.     Right lower leg: No edema.     Left lower leg: No edema.  Skin:    Findings: Rash (mild areas of scattered hypopigmented skin on lower extermities with areas patient has been scratching. ) present.  Neurological:     Mental Status: He is oriented to person, place, and time.     Cranial Nerves: No  cranial nerve deficit.     Motor: No weakness.     Gait: Gait normal.  Psychiatric:        Mood and Affect: Mood normal.        Behavior: Behavior normal.        Thought Content: Thought content normal.        Judgment: Judgment normal.       No results found for any visits on 10/15/20.  Assessment & Plan     Itching - Plan: hydrOXYzine (ATARAX/VISTARIL) 10 MG tablet, ketoconazole (NIZORAL) 2 % cream, Ambulatory referral to Dermatology, CBC with Differential/Platelet, Comprehensive Metabolic Panel (CMET)  Fungal skin infection - Plan: hydrOXYzine (ATARAX/VISTARIL) 10 MG tablet, ketoconazole (NIZORAL) 2 % cream, Ambulatory referral to Dermatology, CBC with Differential/Platelet, Comprehensive Metabolic Panel (CMET)  Meds ordered this encounter  Medications  . hydrOXYzine (ATARAX/VISTARIL) 10 MG tablet    Sig: Take 1 tablet (10 mg total) by mouth 3 (three) times daily as needed (may cause drowsiness.).    Dispense:  30 tablet    Refill:  0  . ketoconazole (NIZORAL) 2 % cream    Sig: Apply 1 application topically daily.    Dispense:  60 g    Refill:  0   Orders Placed This Encounter  Procedures  . CBC with Differential/Platelet  . Comprehensive Metabolic Panel (CMET)  . Ambulatory referral to Dermatology    Referral Priority:   Routine    Referral Type:   Consultation    Referral Reason:   Specialty Services Required    Referred to Provider:   Wanita Chamberlain, MD    Requested Specialty:   Dermatology    Number of Visits Requested:   1   Call if not heard from dermatology appointment within 2 weeks.   Red Flags discussed. The patient was given clear instructions to go to ER or return to medical center if any red flags develop, symptoms do not improve, worsen or new problems develop. They verbalized understanding.  Return in about 1 month (around 11/15/2020), or if symptoms worsen or fail to improve, for at any time for any worsening symptoms, Go to Emergency room/  urgent care if worse.      The entirety of the information documented in the History of Present Illness, Review of Systems and Physical Exam were personally obtained by me. Portions of this information were initially documented by the CMA and reviewed by me for thoroughness and accuracy.      Jairo Ben, FNP  [facility]  Practice [phone] (phone) [phone] (fax)  [place]

## 2020-10-15 NOTE — Patient Instructions (Addendum)
Pruritus Pruritus is an itchy feeling on the skin. One of the most common causes is dry skin, but many different things can cause itching. Most cases of itching do not require medical attention. Sometimes itchy skin can turn into a rash. Follow these instructions at home: Skin care  Apply moisturizing lotion to your skin as needed. Lotion that contains petroleum jelly is best.  Take medicines or apply medicated creams only as told by your health care provider. This may include: ? Corticosteroid cream. ? Anti-itch lotions. ? Oral antihistamines.  Apply a cool, wet cloth (cool compress) to the affected areas.  Take baths with one of the following: ? Epsom salts. You can get these at your local pharmacy or grocery store. Follow the instructions on the packaging. ? Baking soda. Pour a small amount into the bath as told by your health care provider. ? Colloidal oatmeal. You can get this at your local pharmacy or grocery store. Follow the instructions on the packaging.  Apply baking soda paste to your skin. To make the paste, stir water into a small amount of baking soda until it reaches a paste-like consistency.  Do not scratch your skin.  Do not take hot showers or baths, which can make itching worse. A cool shower may help with itching as long as you apply moisturizing lotion after the shower.  Do not use scented soaps, detergents, perfumes, and cosmetic products. Instead, use gentle, unscented versions of these items.   General instructions  Avoid wearing tight clothes.  Keep a journal to help find out what is causing your itching. Write down: ? What you eat and drink. ? What cosmetic products you use. ? What soaps or detergents you use. ? What you wear, including jewelry.  Use a humidifier. This keeps the air moist, which helps to prevent dry skin.  Be aware of any changes in your itchiness. Contact a health care provider if:  The itching does not go away after several  days.  You are unusually thirsty or urinating more than normal.  Your skin tingles or feels numb.  Your skin or the white parts of your eyes turn yellow (jaundice).  You feel weak.  You have any of the following: ? Night sweats. ? Tiredness (fatigue). ? Weight loss. ? Abdominal pain. Summary  Pruritus is an itchy feeling on the skin. One of the most common causes is dry skin, but many different conditions and factors can cause itching.  Apply moisturizing lotion to your skin as needed. Lotion that contains petroleum jelly is best.  Take medicines or apply medicated creams only as told by your health care provider.  Do not take hot showers or baths. Do not use scented soaps, detergents, perfumes, or cosmetic products. This information is not intended to replace advice given to you by your health care provider. Make sure you discuss any questions you have with your health care provider. Document Revised: 09/22/2017 Document Reviewed: 09/22/2017 Elsevier Patient Education  2021 Elsevier Inc. Ketoconazole skin gel What is this medicine? Ketoconazole (kee toe KON na zole) is an antifungal medicine. It is used on the skin. It treats skin infections caused by fungus. This medicine may be used for other purposes; ask your health care provider or pharmacist if you have questions. COMMON BRAND NAME(S): Xolegel What should I tell my health care provider before I take this medicine? They need to know if you have any of these conditions:  large areas of burned or damaged  skin  an unusual or allergic reaction to ketoconazole, other medicines, foods, dyes, or preservatives  pregnant or trying to get pregnant  breast-feeding How should I use this medicine? This medicine is for external use only. Do not take by mouth. Wash your hands before and after use. If you are treating your hands, only wash your hands before use. Do not use on healthy skin or over large areas of skin. Do not get this  medicine in your eyes. If you do, rinse it out with plenty of cool tap water. Use it as directed on the label at the same time every day. Do not use it more often than directed or for a longer time period than prescribed by your health care provider. Keep taking it unless your health care provider tells you to stop. Apply a thin film to the affected area and rub gently. Do not bandage or wrap the skin being treated unless directed to do so by your doctor or health care provider. Talk to your health care provider about the use of this medicine in children. Special care may be needed. Overdosage: If you think you have taken too much of this medicine contact a poison control center or emergency room at once. NOTE: This medicine is only for you. Do not share this medicine with others. What if I miss a dose? If you miss a dose, use it as soon as you can. If it is almost time for your next dose, use only that dose. Do not use double or extra doses. What may interact with this medicine? Interactions are not expected. Do not use any other skin products without telling your doctor or health care professional. This list may not describe all possible interactions. Give your health care provider a list of all the medicines, herbs, non-prescription drugs, or dietary supplements you use. Also tell them if you smoke, drink alcohol, or use illegal drugs. Some items may interact with your medicine. What should I watch for while using this medicine? Visit your health care provider for regular checks on your progress. Tell your health care provider if your symptoms do not start to get better or if they get worse. After bathing, make sure your skin is very dry. Fungal infections like moist conditions. Do not walk around barefoot. To help prevent reinfection, wear freshly washed cotton, not synthetic, clothing. Tell your health care provider if you develop sores or blisters that do not heal properly. If your skin infection  returns after you stop using this medicine, contact your health care provider. What side effects may I notice from receiving this medicine? Side effects that you should report to your doctor or health care provider as soon as possible:  allergic reactions (skin rash, itching or hives; swelling of the face, lips, or tongue)  burning, crusting, itching, stinging, swelling, tenderness of skin Side effects that usually do not require medical attention (report to your doctor or health care provider if they continue or are bothersome):  mild skin irritation, redness, or dryness This list may not describe all possible side effects. Call your doctor for medical advice about side effects. You may report side effects to FDA at 1-800-FDA-1088. Where should I keep my medicine? Keep out of the reach of children and pets. Store at room temperature between 20 and 25 degrees C (68 and 77 degrees F). Get rid of any unused medicine after the expiration date. This medicine is flammable. Avoid exposure to heat, fire, flame, and smoking. NOTE: This  sheet is a summary. It may not cover all possible information. If you have questions about this medicine, talk to your doctor, pharmacist, or health care provider.  2021 Elsevier/Gold Standard (2019-09-22 12:13:18) Hydroxyzine capsules or tablets What is this medicine? HYDROXYZINE (hye DROX i zeen) is an antihistamine. This medicine is used to treat allergy symptoms. It is also used to treat anxiety and tension. This medicine can be used with other medicines to induce sleep before surgery. This medicine may be used for other purposes; ask your health care provider or pharmacist if you have questions. COMMON BRAND NAME(S): ANX, Atarax, Rezine, Vistaril What should I tell my health care provider before I take this medicine? They need to know if you have any of these conditions:  glaucoma  heart disease  history of irregular heartbeat  kidney disease  liver  disease  lung or breathing disease, like asthma  stomach or intestine problems  thyroid disease  trouble passing urine  an unusual or allergic reaction to hydroxyzine, cetirizine, other medicines, foods, dyes or preservatives  pregnant or trying to get pregnant  breast-feeding How should I use this medicine? Take this medicine by mouth with a full glass of water. Follow the directions on the prescription label. You may take this medicine with food or on an empty stomach. Take your medicine at regular intervals. Do not take your medicine more often than directed. Talk to your pediatrician regarding the use of this medicine in children. Special care may be needed. While this drug may be prescribed for children as young as 466 years of age for selected conditions, precautions do apply. Patients over 29 years old may have a stronger reaction and need a smaller dose. Overdosage: If you think you have taken too much of this medicine contact a poison control center or emergency room at once. NOTE: This medicine is only for you. Do not share this medicine with others. What if I miss a dose? If you miss a dose, take it as soon as you can. If it is almost time for your next dose, take only that dose. Do not take double or extra doses. What may interact with this medicine? Do not take this medicine with any of the following medications:  cisapride  dronedarone  pimozide  thioridazine This medicine may also interact with the following medications:  alcohol  antihistamines for allergy, cough, and cold  atropine  barbiturate medicines for sleep or seizures, like phenobarbital  certain antibiotics like erythromycin or clarithromycin  certain medicines for anxiety or sleep  certain medicines for bladder problems like oxybutynin, tolterodine  certain medicines for depression or psychotic disturbances  certain medicines for irregular heart beat  certain medicines for Parkinson's  disease like benztropine, trihexyphenidyl  certain medicines for seizures like phenobarbital, primidone  certain medicines for stomach problems like dicyclomine, hyoscyamine  certain medicines for travel sickness like scopolamine  ipratropium  narcotic medicines for pain  other medicines that prolong the QT interval (an abnormal heart rhythm) like dofetilide This list may not describe all possible interactions. Give your health care provider a list of all the medicines, herbs, non-prescription drugs, or dietary supplements you use. Also tell them if you smoke, drink alcohol, or use illegal drugs. Some items may interact with your medicine. What should I watch for while using this medicine? Tell your doctor or health care professional if your symptoms do not improve. You may get drowsy or dizzy. Do not drive, use machinery, or do anything that needs mental  alertness until you know how this medicine affects you. Do not stand or sit up quickly, especially if you are an older patient. This reduces the risk of dizzy or fainting spells. Alcohol may interfere with the effect of this medicine. Avoid alcoholic drinks. Your mouth may get dry. Chewing sugarless gum or sucking hard candy, and drinking plenty of water may help. Contact your doctor if the problem does not go away or is severe. This medicine may cause dry eyes and blurred vision. If you wear contact lenses you may feel some discomfort. Lubricating drops may help. See your eye doctor if the problem does not go away or is severe. If you are receiving skin tests for allergies, tell your doctor you are using this medicine. What side effects may I notice from receiving this medicine? Side effects that you should report to your doctor or health care professional as soon as possible:  allergic reactions like skin rash, itching or hives, swelling of the face, lips, or tongue  changes in vision  confusion  fast, irregular  heartbeat  seizures  tremor  trouble passing urine or change in the amount of urine Side effects that usually do not require medical attention (report to your doctor or health care professional if they continue or are bothersome):  constipation  drowsiness  dry mouth  headache  tiredness This list may not describe all possible side effects. Call your doctor for medical advice about side effects. You may report side effects to FDA at 1-800-FDA-1088. Where should I keep my medicine? Keep out of the reach of children. Store at room temperature between 15 and 30 degrees C (59 and 86 degrees F). Keep container tightly closed. Throw away any unused medicine after the expiration date. NOTE: This sheet is a summary. It may not cover all possible information. If you have questions about this medicine, talk to your doctor, pharmacist, or health care provider.  2021 Elsevier/Gold Standard (2018-08-30 13:19:55) Foot Pain Many things can cause foot pain. Some common causes are:  An injury.  A sprain.  Arthritis.  Blisters.  Bunions. Follow these instructions at home: Managing pain, stiffness, and swelling If directed, put ice on the painful area:  Put ice in a plastic bag.  Place a towel between your skin and the bag.  Leave the ice on for 20 minutes, 2-3 times a day.   Activity  Do not stand or walk for long periods.  Return to your normal activities as told by your health care provider. Ask your health care provider what activities are safe for you.  Do stretches to relieve foot pain and stiffness as told by your health care provider.  Do not lift anything that is heavier than 10 lb (4.5 kg), or the limit that you are told, until your health care provider says that it is safe. Lifting a lot of weight can put added pressure on your feet. Lifestyle  Wear comfortable, supportive shoes that fit you well. Do not wear high heels.  Keep your feet clean and dry. General  instructions  Take over-the-counter and prescription medicines only as told by your health care provider.  Rub your foot gently.  Pay attention to any changes in your symptoms.  Keep all follow-up visits as told by your health care provider. This is important. Contact a health care provider if:  Your pain does not get better after a few days of self-care.  Your pain gets worse.  You cannot stand on your foot. Get  help right away if:  Your foot is numb or tingling.  Your foot or toes are swollen.  Your foot or toes turn white or blue.  You have warmth and redness along your foot. Summary  Common causes of foot pain are injury, sprain, arthritis, blisters or bunions.  Ice, medicines, and comfortable shoes may help foot pain.  Contact your health care provider if your pain does not get better after a few days of self-care. This information is not intended to replace advice given to you by your health care provider. Make sure you discuss any questions you have with your health care provider. Document Revised: 06/24/2018 Document Reviewed: 06/24/2018 Elsevier Patient Education  2021 [employer].

## 2020-10-16 DIAGNOSIS — L299 Pruritus, unspecified: Secondary | ICD-10-CM | POA: Insufficient documentation

## 2020-10-16 DIAGNOSIS — B369 Superficial mycosis, unspecified: Secondary | ICD-10-CM | POA: Insufficient documentation

## 2021-11-02 IMAGING — CT CT RENAL STONE PROTOCOL
3 of 4 series · 8 of 46 positions shown, 15 images · non-contrast
Comparison: Renal ultrasound 07/30/2018, CT abdomen/pelvis
07/26/2018

CLINICAL DATA: Flank pain, recurrent stone disease suspected.
Additional provided: Pain right flank since yesterday, history of
kidney stones.

EXAM:
CT ABDOMEN AND PELVIS WITHOUT CONTRAST
TECHNIQUE: Multidetector CT imaging of the abdomen and pelvis was performed
following the standard protocol without IV contrast.

[Series 4: lung bases · axial · 0.69mm/px · z∈[-778,-723]mm · 4 of 19 slices shown, 9 images]
[im 4/19  soft-tissue]
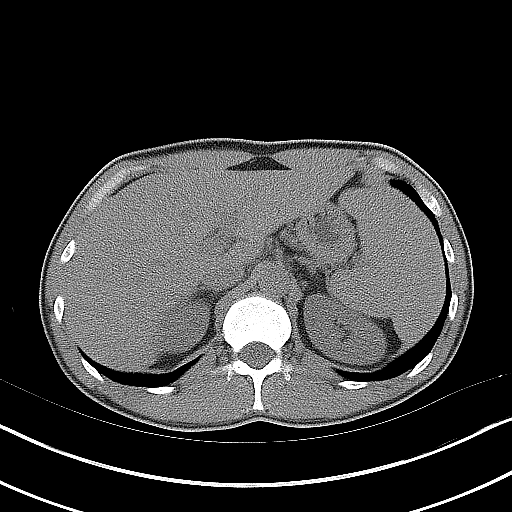
[im 4/19  lung]
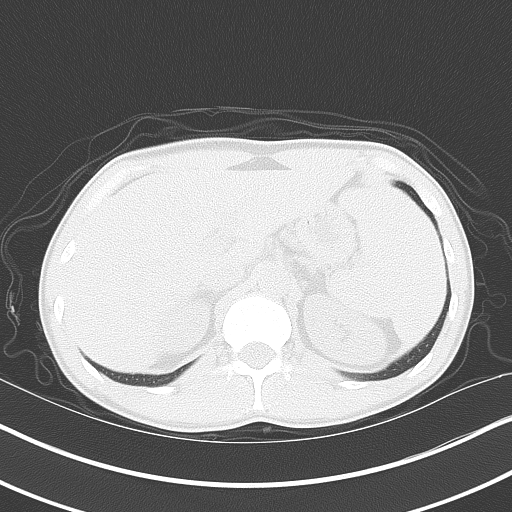
[im 4/19  bone]
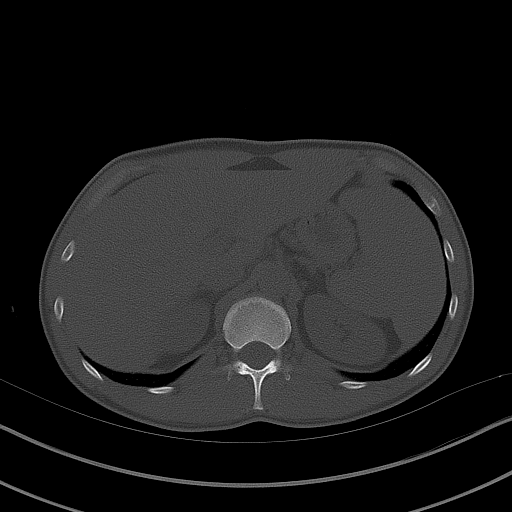
[im 8/19  soft-tissue]
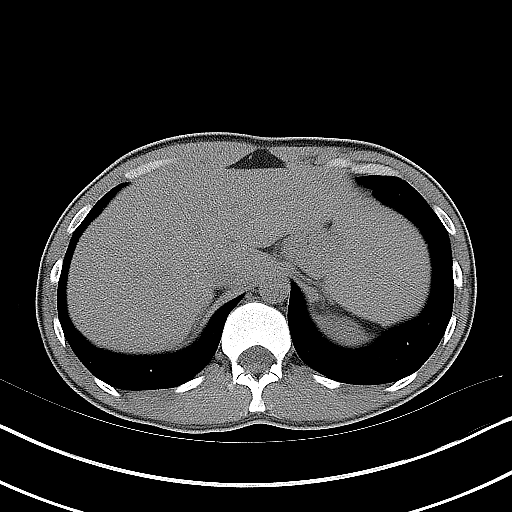
[im 8/19  lung]
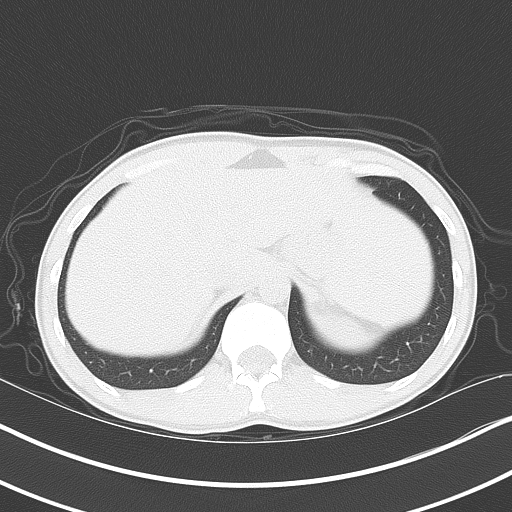
[im 11/19  soft-tissue]
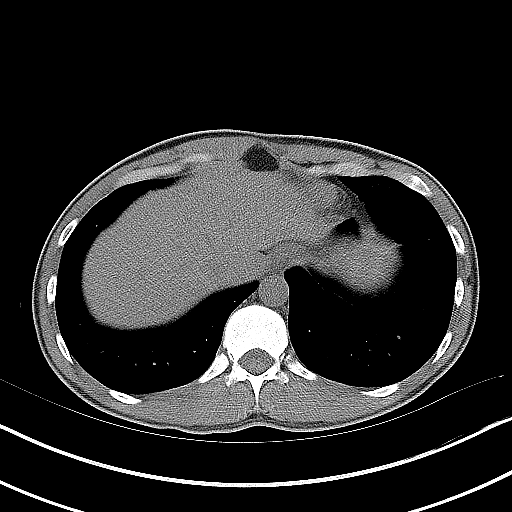
[im 11/19  lung]
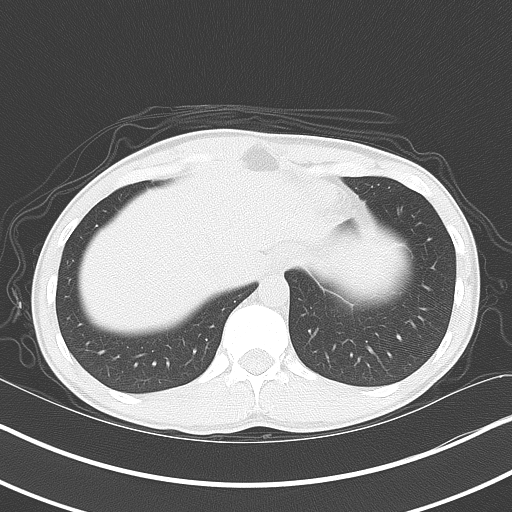
[im 15/19  soft-tissue]
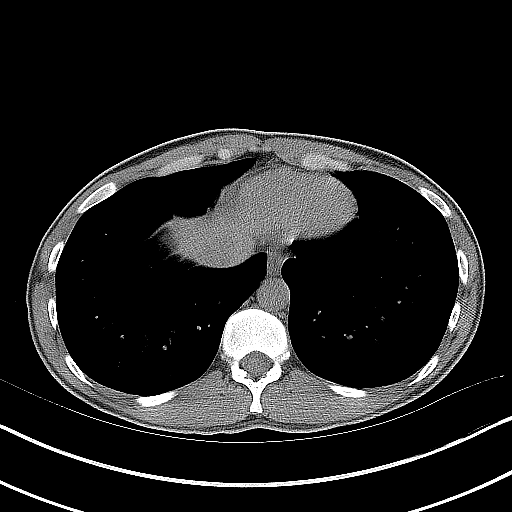
[im 15/19  lung]
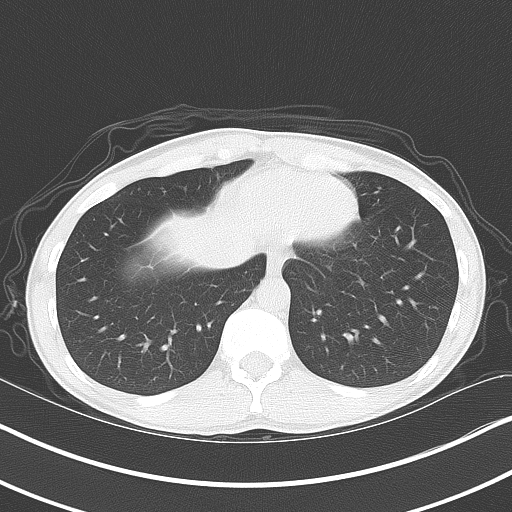

[Series 5: coronal · coronal · 0.71mm/px · 3 of 107 slices shown, 4 images]
[im 36/107  soft-tissue]
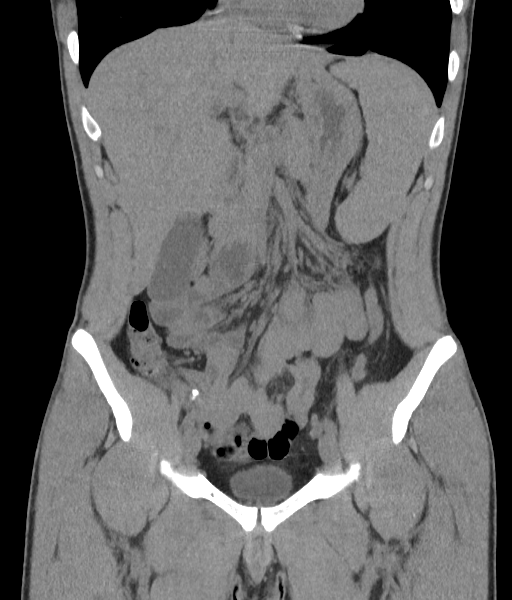
[im 48/107  soft-tissue]
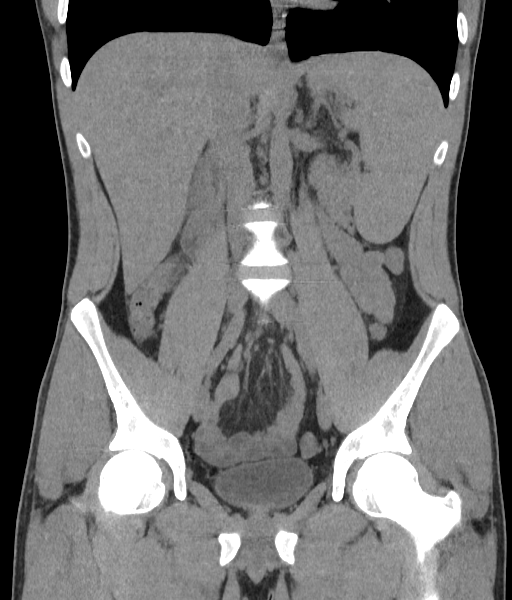
[im 48/107  bone]
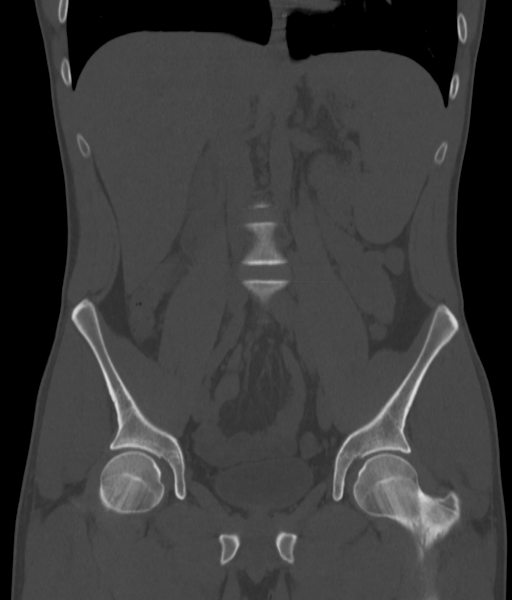
[im 59/107  soft-tissue]
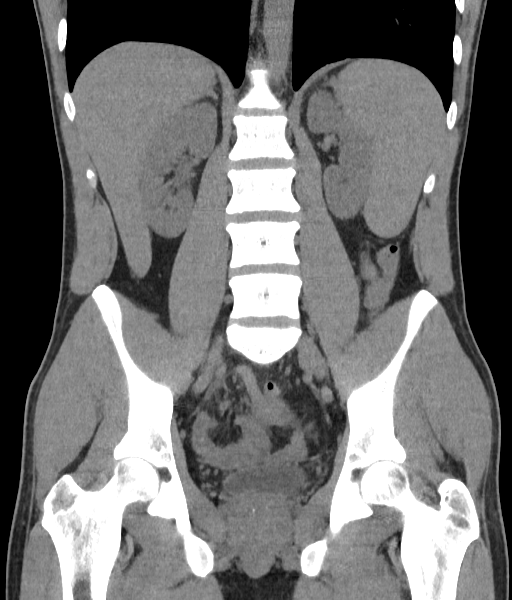

[Series 6: sagittal · sagittal · 0.43mm/px · 1 of 166 slices shown, 2 images]
[im 56/166  soft-tissue]
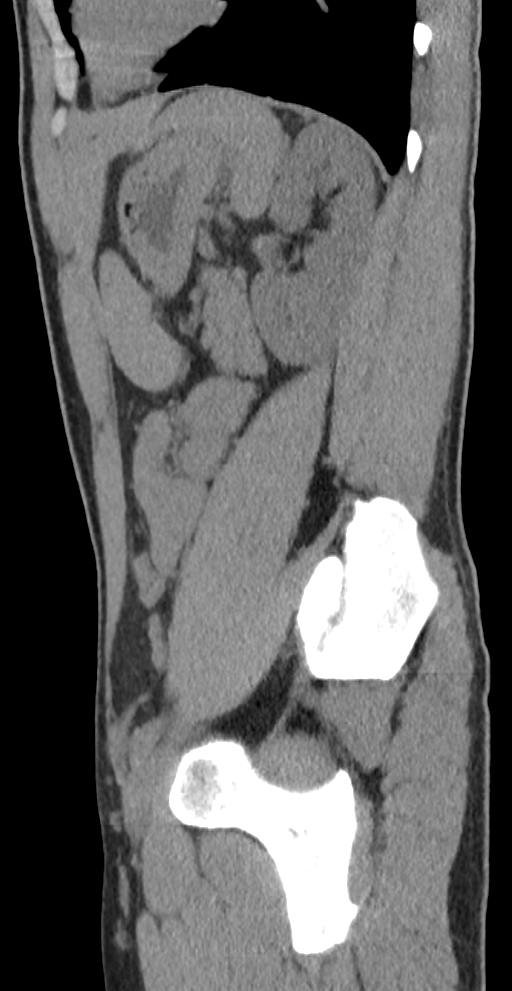
[im 56/166  bone]
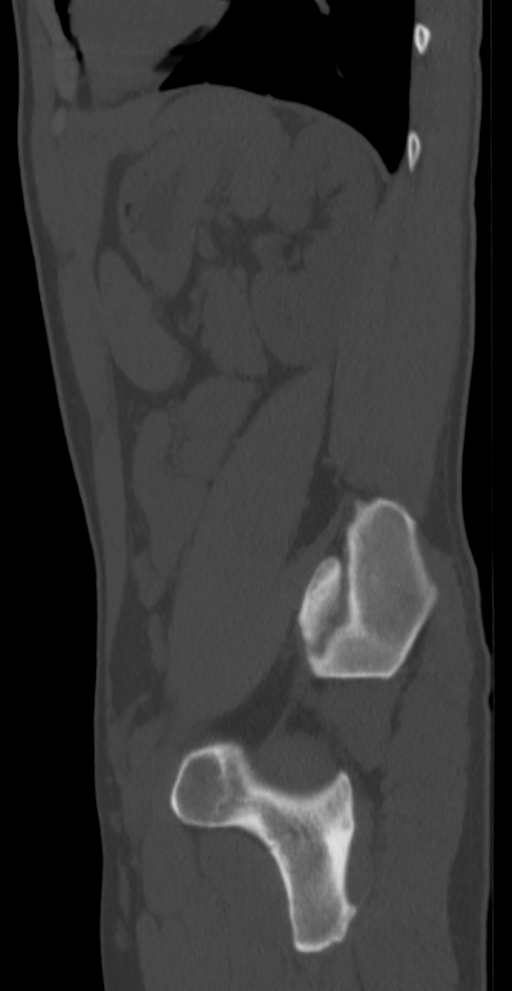

[8 of 46 positions shown; findings below may reference images not displayed]

FINDINGS: Lower chest: The visualized lungs are clear.The imaged heart is
normal in size.

Hepatobiliary: No evidence of focal liver lesion.The common duct is
normal in caliber.

Pancreas: No evidence of focal lesion.No ductal dilatation or
peripancreatic edema.

Spleen: No evidence of focal lesion.No splenomegaly.

Adrenals/Urinary Tract:

-- No adrenal gland mass.

-- Punctate non-obstructing stones within the interpolar right
kidney (series 5, image 64) and lower pole of the right kidney
(series 5, image 63). Mild asymmetric prominence of the right ureter
without ureteral calculus identified. A previously demonstrated
distal right ureteral calculus is no longer seen. The left kidney is
unremarkable. The bladder is underdistended but unremarkable.

Stomach/Bowel: The stomach is unremarkable. No dilated loops of
bowel. Prior appendectomy.

Vascular/Lymphatic:

-- No abdominal aortic aneurysm.

-- No lymphadenopathy.

Reproductive: Incompletely assessed by CT modality.No pathologic
findings.

Other: No ascites.The body wall is normal.

Musculoskeletal: No acute bony abnormality. Transitional lumbosacral
anatomy.
IMPRESSION: Two punctate non-obstructing calculi within the right renal
collecting system, one interpolar and the other within the lower
pole.

Mild nonspecific asymmetric prominence of the right ureter without
ureteral calculus identified.

## 2022-01-27 ENCOUNTER — Ambulatory Visit: Payer: BC Managed Care – PPO | Admitting: Physician Assistant

## 2022-01-27 NOTE — Progress Notes (Deleted)
      Established patient visit   Patient: Kevin Benton   DOB: 08-May-1991   31 y.o. Male  MRN: [id number] Visit Date: 01/27/2022  Today's healthcare provider: [name], PA-C   No chief complaint on file.  Subjective    Sore Throat  This is a new problem. He has had exposure to strep.   ***  Medications: Outpatient Medications Prior to Visit  Medication Sig  . hydrOXYzine (ATARAX/VISTARIL) 10 MG tablet Take 1 tablet (10 mg total) by mouth 3 (three) times daily as needed (may cause drowsiness.).  Marland Kitchen ketoconazole (NIZORAL) 2 % cream Apply 1 application topically daily.   No facility-administered medications prior to visit.    Review of Systems  {Labs  Heme  Chem  Endocrine  Serology  Results Review (optional):23779}   Objective    There were no vitals taken for this visit. {Show previous vital signs (optional):23777}  Physical Exam  ***  No results found for any visits on 01/27/22.  Assessment & Plan     ***  No follow-ups on file.      {provider attestation***:1}   [name], PA-C  [facility] [phone] (phone) [phone] (fax)  [place]

## 2022-01-30 ENCOUNTER — Ambulatory Visit: Payer: BC Managed Care – PPO | Admitting: Physician Assistant

## 2022-01-30 NOTE — Progress Notes (Deleted)
     I,[name],acting as a Education administrator for [employer], PA-C.,have documented all relevant documentation on the behalf of [name], PA-C,as directed by  [employer], PA-C while in the presence of [employer], PA-C.   Established patient visit   Patient: Kevin Benton   DOB: [birth year]   31 y.o. Male  MRN: [id number] Visit Date: 01/30/2022  Today's healthcare provider: [name], PA-C   No chief complaint on file. CC: sore throat  Subjective       Medications: Outpatient Medications Prior to Visit  Medication Sig   hydrOXYzine (ATARAX/VISTARIL) 10 MG tablet Take 1 tablet (10 mg total) by mouth 3 (three) times daily as needed (may cause drowsiness.).   ketoconazole (NIZORAL) 2 % cream Apply 1 application topically daily.   No facility-administered medications prior to visit.    Review of Systems  {Labs  Heme  Chem  Endocrine  Serology  Results Review (optional):23779}   Objective    There were no vitals taken for this visit. {Show previous vital signs (optional):23777}  Physical Exam  ***  No results found for any visits on 01/30/22.  Assessment & Plan     ***  No follow-ups on file.      {provider attestation***:1}   [name], Hershal Coria  [facility] [phone] (phone) [phone] (fax)  [place]

## 2022-02-10 ENCOUNTER — Ambulatory Visit: Payer: BC Managed Care – PPO | Admitting: Physician Assistant

## 2022-02-10 NOTE — Progress Notes (Unsigned)
     I,Kevin Benton,acting as a Education administrator for [employer], PA-C.,have documented all relevant documentation on the behalf of Kevin Kirschner, PA-C,as directed by  Kevin Kirschner, PA-C while in the presence of Kevin Kirschner, PA-C.   Established patient visit   Patient: Kevin Benton   DOB: 06/22/1991   31 y.o. Male  MRN: [id number] Visit Date: 02/11/2022  Today's healthcare provider: [name], PA-C   No chief complaint on file.  Subjective    HPI  Patient is being seen today with concerns of possible strep.   Medications: Outpatient Medications Prior to Visit  Medication Sig   hydrOXYzine (ATARAX/VISTARIL) 10 MG tablet Take 1 tablet (10 mg total) by mouth 3 (three) times daily as needed (may cause drowsiness.).   ketoconazole (NIZORAL) 2 % cream Apply 1 application topically daily.   No facility-administered medications prior to visit.    Review of Systems  {Labs  Heme  Chem  Endocrine  Serology  Results Review (optional):23779}   Objective    There were no vitals taken for this visit. {Show previous vital signs (optional):23777}  Physical Exam  ***  No results found for any visits on 02/11/22.  Assessment & Plan     ***  No follow-ups on file.      {provider attestation***:1}   Kevin Kirschner, PA-C  [facility] [phone] (phone) [phone] (fax)  [place]

## 2022-02-11 ENCOUNTER — Encounter: Payer: Self-pay | Admitting: Physician Assistant

## 2022-02-11 ENCOUNTER — Ambulatory Visit: Payer: BC Managed Care – PPO | Admitting: Physician Assistant

## 2022-02-11 VITALS — BP 128/88 | HR 60 | Ht 68.0 in | Wt 152.9 lb

## 2022-02-11 DIAGNOSIS — R1084 Generalized abdominal pain: Secondary | ICD-10-CM

## 2022-02-11 DIAGNOSIS — K219 Gastro-esophageal reflux disease without esophagitis: Secondary | ICD-10-CM | POA: Insufficient documentation

## 2022-02-11 MED ORDER — PANTOPRAZOLE SODIUM 40 MG PO TBEC: 40.0000 mg | DELAYED_RELEASE_TABLET | Freq: Every day | ORAL | 1 refills | Status: AC

## 2022-02-11 MED ORDER — SUCRALFATE 1 G PO TABS: 1.0000 g | ORAL_TABLET | Freq: Three times a day (TID) | ORAL | 0 refills | Status: DC

## 2022-02-11 NOTE — Assessment & Plan Note (Signed)
LLQ tenderness Will check GERD labs and tx for GERD and continue to monitor pain. Advised pt if no improvement on medications and negative testing, will refer to GI

## 2022-02-11 NOTE — Assessment & Plan Note (Signed)
Will rx pantoprazole 40 mg, take in AM on empty stomach for at least 4-6 weeks.  Also sent carafate, can take QID.  Will check CMP, CBC, hpylori breath test

## 2022-02-12 LAB — CBC WITH DIFFERENTIAL/PLATELET
Basophils Absolute: 0 10*3/uL (ref 0.0–0.2)
EOS (ABSOLUTE): 0.3 10*3/uL (ref 0.0–0.4)
Monocytes: 6 %
Neutrophils Absolute: 3.8 10*3/uL (ref 1.4–7.0)
WBC: 6.7 10*3/uL (ref 3.4–10.8)

## 2022-02-12 LAB — COMPREHENSIVE METABOLIC PANEL
Alkaline Phosphatase: 48 IU/L (ref 44–121)
BUN: 8 mg/dL (ref 6–20)
Creatinine, Ser: 0.9 mg/dL (ref 0.76–1.27)
Potassium: 4.1 mmol/L (ref 3.5–5.2)
Sodium: 141 mmol/L (ref 134–144)

## 2022-02-13 ENCOUNTER — Other Ambulatory Visit: Payer: Self-pay | Admitting: Physician Assistant

## 2022-02-13 DIAGNOSIS — A048 Other specified bacterial intestinal infections: Secondary | ICD-10-CM

## 2022-02-13 LAB — COMPREHENSIVE METABOLIC PANEL
ALT: 24 IU/L (ref 0–44)
AST: 19 IU/L (ref 0–40)
Albumin/Globulin Ratio: 1.8 (ref 1.2–2.2)
Albumin: 4.9 g/dL (ref 4.1–5.2)
BUN/Creatinine Ratio: 9 (ref 9–20)
Bilirubin Total: 0.9 mg/dL (ref 0.0–1.2)
CO2: 26 mmol/L (ref 20–29)
Calcium: 9.4 mg/dL (ref 8.7–10.2)
Chloride: 103 mmol/L (ref 96–106)
Globulin, Total: 2.8 g/dL (ref 1.5–4.5)
Glucose: 96 mg/dL (ref 70–99)
Total Protein: 7.7 g/dL (ref 6.0–8.5)
eGFR: 119 mL/min/{1.73_m2} (ref >59)

## 2022-02-13 LAB — CBC WITH DIFFERENTIAL/PLATELET
Basos: 1 %
Eos: 5 %
Hematocrit: 38.4 % (ref 37.5–51.0)
Hemoglobin: 13.1 g/dL (ref 13.0–17.7)
Immature Grans (Abs): 0 10*3/uL (ref 0.0–0.1)
Immature Granulocytes: 0 %
Lymphocytes Absolute: 2.1 10*3/uL (ref 0.7–3.1)
Lymphs: 32 %
MCH: 30.5 pg (ref 26.6–33.0)
MCHC: 34.1 g/dL (ref 31.5–35.7)
MCV: 89 fL (ref 79–97)
Monocytes Absolute: 0.4 10*3/uL (ref 0.1–0.9)
Neutrophils: 56 %
Platelets: 241 10*3/uL (ref 150–450)
RBC: 4.3 x10E6/uL (ref 4.14–5.80)
RDW: 12.4 % (ref 11.6–15.4)

## 2022-02-13 LAB — H. PYLORI BREATH TEST: H pylori Breath Test: POSITIVE — AB

## 2022-02-13 MED ORDER — CLARITHROMYCIN 500 MG PO TABS: 500.0000 mg | ORAL_TABLET | Freq: Two times a day (BID) | ORAL | 0 refills | Status: AC

## 2022-02-13 MED ORDER — AMOXICILLIN 500 MG PO TABS: 1000.0000 mg | ORAL_TABLET | Freq: Two times a day (BID) | ORAL | 0 refills | Status: AC

## 2022-02-18 ENCOUNTER — Encounter: Payer: Self-pay | Admitting: Physician Assistant

## 2022-03-20 ENCOUNTER — Encounter: Payer: Self-pay | Admitting: Physician Assistant

## 2022-03-20 ENCOUNTER — Ambulatory Visit (INDEPENDENT_AMBULATORY_CARE_PROVIDER_SITE_OTHER): Payer: BC Managed Care – PPO | Admitting: Physician Assistant

## 2022-03-20 VITALS — BP 114/69 | HR 64 | Ht 68.0 in | Wt 155.4 lb

## 2022-03-20 DIAGNOSIS — K219 Gastro-esophageal reflux disease without esophagitis: Secondary | ICD-10-CM | POA: Diagnosis not present

## 2022-03-20 DIAGNOSIS — A048 Other specified bacterial intestinal infections: Secondary | ICD-10-CM | POA: Insufficient documentation

## 2022-03-20 DIAGNOSIS — R350 Frequency of micturition: Secondary | ICD-10-CM | POA: Diagnosis not present

## 2022-03-20 DIAGNOSIS — R1084 Generalized abdominal pain: Secondary | ICD-10-CM

## 2022-03-20 MED ORDER — SUCRALFATE 1 G PO TABS: 1.0000 g | ORAL_TABLET | Freq: Three times a day (TID) | ORAL | 0 refills | Status: AC

## 2022-03-20 NOTE — Assessment & Plan Note (Signed)
Still uncontrolled despite hyplori treatment. Continue pantoprazole, add carafate in especially at nighttime Urgent ref to gi for endoscopy

## 2022-03-20 NOTE — Progress Notes (Signed)
I,[name],acting as a Neurosurgeon for [employer], PA-C.,have documented all relevant documentation on the behalf of [name], PA-C,as directed by  [name], PA-C while in the presence of [name], PA-C.   Established patient visit   Patient: Kevin Benton   DOB: [birth year]   31 y.o. Male  MRN: [id number] Visit Date: 03/20/2022  Today's healthcare provider: [name], PA-C   Cc. Hpylori f/u  Subjective    HPI  Patient is being seen today to follow-up after completing treatment for h.pylori. Reports initially feeling better but now symptoms are even worse than when they first started. Still taking pantoprazole daily. Not sleeping secondary to stomach pain and heat coming up throat.  Feeling of stuck sensation in throat. Denies SOB, cough, hemoptysis, nausea, vomiting.   Reports for the past few years dealing with frequent urination '60 times a day' been eval by urology before, hx of nephrolithiasis. Symptoms are still present, last f/u with urology 08/2019. He thinks he could have residual scar tissue from appendectomy.   Medications: Outpatient Medications Prior to Visit  Medication Sig   pantoprazole (PROTONIX) 40 MG tablet Take 1 tablet (40 mg total) by mouth daily.   sucralfate (CARAFATE) 1 g tablet Take 1 tablet (1 g total) by mouth 4 (four) times daily -  with meals and at bedtime for 7 days.   No facility-administered medications prior to visit.    Review of Systems  Constitutional:  Negative for fatigue and fever.  Respiratory:  Negative for cough and shortness of breath.   Cardiovascular:  Negative for chest pain, palpitations and leg swelling.  Neurological:  Negative for dizziness and headaches.      Objective    . Blood pressure 114/69, pulse 64, height 5\' 8"  (1.727 m), weight 155 lb 6.4 oz (70.5 kg), SpO2 100 %.   Physical Exam Vitals reviewed.  Constitutional:      Appearance: He is not ill-appearing.  HENT:     Head:  Normocephalic.  Eyes:     Conjunctiva/sclera: Conjunctivae normal.  Cardiovascular:     Rate and Rhythm: Normal rate.  Pulmonary:     Effort: Pulmonary effort is normal. No respiratory distress.  Abdominal:     General: Abdomen is flat. There is no distension.     Palpations: Abdomen is soft.     Tenderness: There is abdominal tenderness in the suprapubic area. Negative signs include Rovsing's sign.     Hernia: No hernia is present.  Neurological:     General: No focal deficit present.     Mental Status: He is alert and oriented to person, place, and time.  Psychiatric:        Mood and Affect: Mood normal.        Behavior: Behavior normal.      No results found for any visits on 03/20/22.  Assessment & Plan     Problem List Items Addressed This Visit       Digestive   Gastroesophageal reflux disease without esophagitis    Still uncontrolled despite hyplori treatment. Continue pantoprazole, add carafate in especially at nighttime Urgent ref to gi for endoscopy      Relevant Medications   sucralfate (CARAFATE) 1 g tablet   Other Relevant Orders   Ambulatory referral to Gastroenterology     Other   Generalized abdominal pain    More focal today -- suprapubic. Led to discussion of bladder symptoms Symptoms are chronic and unchanged, advised if any dysuria  return to office for UA and culture      Relevant Orders   Ambulatory referral to Gastroenterology   Ambulatory referral to Urology   Frequent urination    Advised this is highly abnormal, ref back to urology No concern for UTI, nephrolithiasis today IC?      Relevant Orders   Ambulatory referral to Urology   Bacterial infection due to H. pylori - Primary    Completed treatment, retesting today      Relevant Orders   H. pylori breath test   Ambulatory referral to Gastroenterology     I, [name], PA-C have reviewed all documentation for this visit. The documentation on  03/20/2022 for the exam,  diagnosis, procedures, and orders are all accurate and complete.  [name], PA-C [facility] [address] [place], [place], [address] Office: [phone] Fax: [phone]   [facility]

## 2022-03-20 NOTE — Assessment & Plan Note (Signed)
Completed treatment, retesting today

## 2022-03-20 NOTE — Assessment & Plan Note (Addendum)
Advised this is highly abnormal, ref back to urology No concern for UTI, nephrolithiasis today d/t chronic and unchanged nature of symptoms IC?

## 2022-03-20 NOTE — Assessment & Plan Note (Signed)
More focal today -- suprapubic. Led to discussion of bladder symptoms Symptoms are chronic and unchanged, advised if any dysuria return to office for UA and culture

## 2022-03-21 LAB — H. PYLORI BREATH TEST: H pylori Breath Test: NEGATIVE

## 2022-03-26 ENCOUNTER — Ambulatory Visit (INDEPENDENT_AMBULATORY_CARE_PROVIDER_SITE_OTHER): Payer: BC Managed Care – PPO | Admitting: Gastroenterology

## 2022-03-26 ENCOUNTER — Encounter: Payer: Self-pay | Admitting: Gastroenterology

## 2022-03-26 VITALS — BP 116/74 | HR 65 | Temp 98.7°F | Ht 68.0 in | Wt 156.0 lb

## 2022-03-26 DIAGNOSIS — R1013 Epigastric pain: Secondary | ICD-10-CM

## 2022-03-26 DIAGNOSIS — K219 Gastro-esophageal reflux disease without esophagitis: Secondary | ICD-10-CM

## 2022-03-26 DIAGNOSIS — R194 Change in bowel habit: Secondary | ICD-10-CM | POA: Diagnosis not present

## 2022-03-26 DIAGNOSIS — G8929 Other chronic pain: Secondary | ICD-10-CM | POA: Diagnosis not present

## 2022-03-26 MED ORDER — NA SULFATE-K SULFATE-MG SULF 17.5-3.13-1.6 GM/177ML PO SOLN
1.0000 | Freq: Once | ORAL | 0 refills | Status: AC
Start: 2022-03-26 — End: 2022-03-26

## 2022-03-26 NOTE — Addendum Note (Signed)
Addended by: Roena Malady on: 03/26/2022 05:56 PM   Modules accepted: Orders

## 2022-03-26 NOTE — Progress Notes (Signed)
Gastroenterology Consultation  Referring Provider:     [name], PA-C Primary Care Physician:  [name], PA-C Primary Gastroenterologist:  Dr. Servando Snare     Reason for Consultation:     Lower abdominal pain        HPI:   Kevin Benton is a 31 y.o. y/o male referred for consultation & management of Lower abdominal pain by Dr. [name], PA-C.  This patient comes in today with a history of abdominal pain.  The patient states he has been to multiple doctors for this abdominal pain is was followed by urology who said that his pain was likely caused by kidney stones.  The patient states that even after he got recurrent episodes he still at the lower abdominal pain.  He reports a lot of the symptoms started after he had his appendix out.  The patient also reports that he has frequent urination with up to 30 trips to the bathroom a day.  He also reports that he has 6-7 bowel movements per day. There is no report of any unexplained weight loss.  The patient states that he does not gain weight easily.  There is no report of any black stools or bloody stools.  He also reports that he had significant amount of heartburn despite being on a PPI.  He states that he has burning around his tongue nd throat..  Past Medical History:  Diagnosis Date   History of kidney stones    Kidney stone    Pneumonia     Past Surgical History:  Procedure Laterality Date   APPENDECTOMY     EXTRACORPOREAL SHOCK WAVE LITHOTRIPSY Right 07/29/2018   Procedure: EXTRACORPOREAL SHOCK WAVE LITHOTRIPSY (ESWL);  Surgeon: Sondra Come, MD;  Location: [facility] ORS;  Service: Urology;  Laterality: Right;    Prior to Admission medications   Medication Sig Start Date End Date Taking? Authorizing Provider  pantoprazole (PROTONIX) 40 MG tablet Take 1 tablet (40 mg total) by mouth daily. 02/11/22   [name], PA-C  sucralfate (CARAFATE) 1 g tablet Take 1 tablet (1 g total) by mouth 4 (four) times daily -  with  meals and at bedtime for 7 days. 03/20/22 03/27/22  [name], PA-C    Family History  Problem Relation Age of Onset   Dementia Mother    Leukemia Maternal Grandmother    Lung cancer Maternal Grandfather    Leukemia Paternal Grandmother    Diabetes Paternal Grandfather      Social History   Tobacco Use   Smoking status: Never   Smokeless tobacco: Never  Vaping Use   Vaping Use: Never used  Substance Use Topics   Alcohol use: No    Comment: Rarely   Drug use: No    Allergies as of 03/26/2022 - Review Complete 03/20/2022  Allergen Reaction Noted   Diphenhydramine Hives 07/26/2018   Cyclobenzaprine Itching 07/28/2018    Review of Systems:    All systems reviewed and negative except where noted in HPI.   Physical Exam:  There were no vitals taken for this visit. No LMP for male patient. General:   Alert,  Well-developed, well-nourished, pleasant and cooperative in NAD Head:  Normocephalic and atraumatic. Eyes:  Sclera clear, no icterus.   Conjunctiva pink. Ears:  Normal auditory acuity. Neck:  Supple; no masses or thyromegaly. Lungs:  Respirations even and unlabored.  Clear throughout to auscultation.   No wheezes, crackles, or rhonchi. No acute distress. Heart:  Regular rate and  rhythm; no murmurs, clicks, rubs, or gallops. Abdomen:  Normal bowel sounds.  No bruits.  Soft, Positive tenderness in the epigastric area and right lower quadrant and non-distended without masses, hepatosplenomegaly or hernias noted.  No guarding or rebound tenderness.  Negative Carnett sign.   Rectal:  Deferred.  Pulses:  Normal pulses noted. Extremities:  No clubbing or edema.  No cyanosis. Neurologic:  Alert and oriented x3;  grossly normal neurologically. Skin:  Intact without significant lesions or rashes.  No jaundice. Lymph Nodes:  No significant cervical adenopathy. Psych:  Alert and cooperative. Normal mood and affect.  Imaging Studies: No results found.  Assessment and Plan:    ALOYSIUS Benton is a 31 y.o. y/o male who comes in today with what sounds like irritable bowel syndrome and history of urination may be associated with interstitial cystitis.  The patient does not reported symptoms to be any worse or better with eating drinking or moving his bowels.  Due to his frequent bowel movements and pain in his right lower quadrant and epigastric area on palpation I will set the patient up for an EGD and colonoscopy to rule out any sign of inflammatory bowel disease or damage due to his chronic reflux which is not being controlled with a PPI.  The patient has been explained the plan and agrees with it.    Midge Minium, MD. Clementeen Graham    Note: This dictation was prepared with Dragon dictation along with smaller phrase technology. Any transcriptional errors that result from this process are unintentional.

## 2022-04-15 ENCOUNTER — Encounter: Payer: Self-pay | Admitting: Gastroenterology

## 2022-04-20 NOTE — Anesthesia Preprocedure Evaluation (Signed)
Anesthesia Evaluation  Patient identified by MRN, date of birth, ID band Patient awake    Reviewed: Allergy & Precautions, NPO status , Patient's Chart, lab work & pertinent test results  Airway Mallampati: III  TM Distance: >3 FB Neck ROM: full    Dental  (+) Chipped   Pulmonary neg pulmonary ROS,    Pulmonary exam normal        Cardiovascular negative cardio ROS Normal cardiovascular exam     Neuro/Psych negative neurological ROS  negative psych ROS   GI/Hepatic Neg liver ROS, GERD  ,Abdominal pain epigastric   Endo/Other  negative endocrine ROS  Renal/GU negative Renal ROS  negative genitourinary   Musculoskeletal   Abdominal   Peds  Hematology negative hematology ROS (+)   Anesthesia Other Findings Past Medical History: No date: Family history of adverse reaction to anesthesia     Comment:  Mother - slow to wake No date: GERD (gastroesophageal reflux disease) No date: History of kidney stones No date: Kidney stone No date: Pneumonia  Past Surgical History: No date: APPENDECTOMY 07/29/2018: EXTRACORPOREAL SHOCK WAVE LITHOTRIPSY; Right     Comment:  Procedure: EXTRACORPOREAL SHOCK WAVE LITHOTRIPSY (ESWL);              Surgeon: Sondra Come, MD;  Location: [facility] ORS;                Service: Urology;  Laterality: Right;  BMI    Body Mass Index: 23.72 kg/m      Reproductive/Obstetrics negative OB ROS                             Anesthesia Physical Anesthesia Plan  ASA: 2  Anesthesia Plan: General   Post-op Pain Management: Minimal or no pain anticipated   Induction: Intravenous  PONV Risk Score and Plan: Propofol infusion, TIVA and Treatment may vary due to age or medical condition  Airway Management Planned: Natural Airway  Additional Equipment:   Intra-op Plan:   Post-operative Plan:   Informed Consent:     Dental Advisory Given  Plan Discussed with:  Anesthesiologist, CRNA and Surgeon  Anesthesia Plan Comments:         Anesthesia Quick Evaluation

## 2022-04-21 ENCOUNTER — Encounter: Payer: Self-pay | Admitting: Gastroenterology

## 2022-04-21 ENCOUNTER — Other Ambulatory Visit: Payer: Self-pay

## 2022-04-21 ENCOUNTER — Ambulatory Visit
Admission: RE | Admit: 2022-04-21 | Discharge: 2022-04-21 | Disposition: A | Discharge status: Home / Self Care | Payer: BC Managed Care – PPO | Attending: Gastroenterology | Admitting: Gastroenterology

## 2022-04-21 ENCOUNTER — Encounter
Admission: RE | Disposition: A | Discharge status: Home / Self Care | Payer: Self-pay | Source: Home / Self Care | Attending: Gastroenterology

## 2022-04-21 ENCOUNTER — Ambulatory Visit: Payer: BC Managed Care – PPO | Admitting: Anesthesiology

## 2022-04-21 DIAGNOSIS — K219 Gastro-esophageal reflux disease without esophagitis: Secondary | ICD-10-CM | POA: Insufficient documentation

## 2022-04-21 DIAGNOSIS — K2950 Unspecified chronic gastritis without bleeding: Secondary | ICD-10-CM | POA: Insufficient documentation

## 2022-04-21 DIAGNOSIS — R1013 Epigastric pain: Secondary | ICD-10-CM | POA: Insufficient documentation

## 2022-04-21 DIAGNOSIS — R194 Change in bowel habit: Secondary | ICD-10-CM

## 2022-04-21 DIAGNOSIS — G8929 Other chronic pain: Secondary | ICD-10-CM

## 2022-04-21 DIAGNOSIS — K529 Noninfective gastroenteritis and colitis, unspecified: Secondary | ICD-10-CM | POA: Insufficient documentation

## 2022-04-21 DIAGNOSIS — K449 Diaphragmatic hernia without obstruction or gangrene: Secondary | ICD-10-CM | POA: Diagnosis not present

## 2022-04-21 DIAGNOSIS — K2980 Duodenitis without bleeding: Secondary | ICD-10-CM | POA: Insufficient documentation

## 2022-04-21 DIAGNOSIS — Z87442 Personal history of urinary calculi: Secondary | ICD-10-CM | POA: Diagnosis not present

## 2022-04-21 HISTORY — DX: Family history of other specified conditions: Z84.89

## 2022-04-21 HISTORY — PX: ESOPHAGOGASTRODUODENOSCOPY: SHX5428

## 2022-04-21 HISTORY — PX: COLONOSCOPY: SHX5424

## 2022-04-21 HISTORY — DX: Gastro-esophageal reflux disease without esophagitis: K21.9

## 2022-04-21 SURGERY — COLONOSCOPY
Anesthesia: General | Site: Rectum

## 2022-04-21 MED ORDER — PROPOFOL 10 MG/ML IV BOLUS
INTRAVENOUS | Status: DC | PRN
  Administered 2022-04-21: 150 ug/kg/min via INTRAVENOUS

## 2022-04-21 MED ORDER — STERILE WATER FOR IRRIGATION IR SOLN
Status: DC | PRN
  Administered 2022-04-21: 50 mL

## 2022-04-21 MED ORDER — LACTATED RINGERS IV SOLN: INTRAVENOUS | Status: DC

## 2022-04-21 MED ORDER — SODIUM CHLORIDE 0.9 % IV SOLN: INTRAVENOUS | Status: DC

## 2022-04-21 MED ORDER — LIDOCAINE HCL (CARDIAC) PF 100 MG/5ML IV SOSY
PREFILLED_SYRINGE | INTRAVENOUS | Status: DC | PRN
  Administered 2022-04-21: 100 mg via INTRAVENOUS

## 2022-04-21 SURGICAL SUPPLY — 8 items
BLOCK BITE 60FR ADLT L/F GRN (MISCELLANEOUS) ×3 IMPLANT
FORCEPS BIOP RAD 4 LRG CAP 4 (CUTTING FORCEPS) ×1 IMPLANT
GOWN CVR UNV OPN BCK APRN NK (MISCELLANEOUS) ×8 IMPLANT
GOWN ISOL THUMB LOOP REG UNIV (MISCELLANEOUS) ×12
KIT PRC NS LF DISP ENDO (KITS) ×4 IMPLANT
KIT PROCEDURE OLYMPUS (KITS) ×6
MANIFOLD NEPTUNE II (INSTRUMENTS) ×6 IMPLANT
WATER STERILE IRR 250ML POUR (IV SOLUTION) ×6 IMPLANT

## 2022-04-21 NOTE — Anesthesia Postprocedure Evaluation (Signed)
Anesthesia Post Note  Patient: Kevin Benton  Procedure(s) Performed: COLONOSCOPY WITH BIOSPY (Rectum) ESOPHAGOGASTRODUODENOSCOPY (EGD) WITH BIOPSY (Mouth)     Patient location during evaluation: PACU Anesthesia Type: General Level of consciousness: awake and alert Pain management: pain level controlled Vital Signs Assessment: post-procedure vital signs reviewed and stable Respiratory status: spontaneous breathing, nonlabored ventilation and respiratory function stable Cardiovascular status: blood pressure returned to baseline and stable Postop Assessment: no apparent nausea or vomiting Anesthetic complications: no   No notable events documented.  [name]

## 2022-04-21 NOTE — Op Note (Signed)
[facility] Gastroenterology Patient Name: Kevin Benton Procedure Date: 04/21/2022 10:34 AM MRN: [id number] Account #: [id number] Date of Birth: 15-Feb-1991 Admit Type: Outpatient Age: 31 Room: [facility] OR ROOM 01 Gender: Male Note Status: Finalized Instrument Name: [id number] Procedure:             Colonoscopy Indications:           Chronic diarrhea Providers:             Lucilla Lame MD, MD Medicines:             Propofol per Anesthesia Complications:         No immediate complications. Procedure:             Pre-Anesthesia Assessment:                        - Prior to the procedure, a History and Physical was                         performed, and patient medications and allergies were                         reviewed. The patient's tolerance of previous                         anesthesia was also reviewed. The risks and benefits                         of the procedure and the sedation options and risks                         were discussed with the patient. All questions were                         answered, and informed consent was obtained. Prior                         Anticoagulants: The patient has taken no previous                         anticoagulant or antiplatelet agents. ASA Grade                         Assessment: II - A patient with mild systemic disease.                         After reviewing the risks and benefits, the patient                         was deemed in satisfactory condition to undergo the                         procedure.                        After obtaining informed consent, the colonoscope was                         passed under direct vision. Throughout the procedure,  the patient's blood pressure, pulse, and oxygen                         saturations were monitored continuously. The                         Colonoscope was introduced through the anus and                         advanced to the the  ileocecal valve. The colonoscopy                         was performed without difficulty. The patient                         tolerated the procedure well. The quality of the bowel                         preparation was excellent. Findings:      The terminal ileum appeared normal. Biopsies were taken with a cold       forceps for histology.      The colon (entire examined portion) appeared normal. Random biopsies       were obtained with cold forceps for histology randomly. Impression:            - The examined portion of the ileum was normal.                         Biopsied.                        - The entire examined colon is normal.                        - Random biopsies were obtained. Recommendation:        - Discharge patient to home.                        - Resume previous diet.                        - Continue present medications.                        - Await pathology results. Procedure Code(s):     --- Professional ---                        [phone], Colonoscopy, flexible; with biopsy, single or                         multiple Diagnosis Code(s):     --- Professional ---                        K52.9, Noninfective gastroenteritis and colitis,                         unspecified CPT copyright 2019 American Medical Association. All rights reserved. The codes documented in this report are preliminary and upon coder review may  be revised to meet current compliance requirements. Harjit Leider [employer]  MD, MD 04/21/2022 11:00:30 AM This report has been signed electronically. Number of Addenda: 0 Note Initiated On: 04/21/2022 10:34 AM Scope Withdrawal Time: 0 hours 7 minutes 8 seconds  Total Procedure Duration: 0 hours 11 minutes 46 seconds  Estimated Blood Loss:  Estimated blood loss: none.      [facility]

## 2022-04-21 NOTE — Op Note (Signed)
[facility] Gastroenterology Patient Name: Kevin Benton Procedure Date: 04/21/2022 10:34 AM MRN: [id number] Account #: [id number] Date of Birth: November 04, 1990 Admit Type: Outpatient Age: 31 Room: [facility] OR ROOM 01 Gender: Male Note Status: Finalized Instrument Name: [id number] Procedure:             Upper GI endoscopy Indications:           Epigastric abdominal pain, Dyspepsia Providers:             Midge Minium MD, MD Medicines:             Propofol per Anesthesia Complications:         No immediate complications. Procedure:             Pre-Anesthesia Assessment:                        - Prior to the procedure, a History and Physical was                         performed, and patient medications and allergies were                         reviewed. The patient's tolerance of previous                         anesthesia was also reviewed. The risks and benefits                         of the procedure and the sedation options and risks                         were discussed with the patient. All questions were                         answered, and informed consent was obtained. Prior                         Anticoagulants: The patient has taken no previous                         anticoagulant or antiplatelet agents. ASA Grade                         Assessment: II - A patient with mild systemic disease.                         After reviewing the risks and benefits, the patient                         was deemed in satisfactory condition to undergo the                         procedure.                        After obtaining informed consent, the endoscope was                         passed under direct vision. Throughout the procedure,  the patient's blood pressure, pulse, and oxygen                         saturations were monitored continuously. The was                         introduced through the mouth, and advanced to the                          second part of duodenum. The upper GI endoscopy was                         accomplished without difficulty. The patient tolerated                         the procedure well. Findings:      A small hiatal hernia was present.      The gastric antrum was normal. Biopsies were taken with a cold forceps       for histology.      The examined duodenum was normal. Biopsies were taken with a cold       forceps for histology. Impression:            - Small hiatal hernia.                        - Normal antrum. Biopsied.                        - Normal examined duodenum. Biopsied. Recommendation:        - Discharge patient to home.                        - Resume previous diet.                        - Continue present medications.                        - Perform a colonoscopy today. Procedure Code(s):     --- Professional ---                        [id number], Esophagogastroduodenoscopy, flexible,                         transoral; with biopsy, single or multiple Diagnosis Code(s):     --- Professional ---                        R10.13, Epigastric pain CPT copyright 2019 American Medical Association. All rights reserved. The codes documented in this report are preliminary and upon coder review may  be revised to meet current compliance requirements. Midge Minium MD, MD 04/21/2022 10:47:44 AM This report has been signed electronically. Number of Addenda: 0 Note Initiated On: 04/21/2022 10:34 AM Total Procedure Duration: 0 hours 4 minutes 42 seconds  Estimated Blood Loss:  Estimated blood loss: none.      [facility]

## 2022-04-21 NOTE — Transfer of Care (Signed)
Immediate Anesthesia Transfer of Care Note  Patient: Kevin Benton  Procedure(s) Performed: COLONOSCOPY WITH BIOSPY (Rectum) ESOPHAGOGASTRODUODENOSCOPY (EGD) WITH BIOPSY (Mouth)  Patient Location: PACU  Anesthesia Type: General  Level of Consciousness: awake, alert  and patient cooperative  Airway and Oxygen Therapy: Patient Spontanous Breathing and Patient connected to supplemental oxygen  Post-op Assessment: Post-op Vital signs reviewed, Patient's Cardiovascular Status Stable, Respiratory Function Stable, Patent Airway and No signs of Nausea or vomiting  Post-op Vital Signs: Reviewed and stable  Complications: No notable events documented.

## 2022-04-21 NOTE — H&P (Signed)
Midge Minium, MD [facility] [address] [place], [place] [address] Phone:[phone] Fax : [phone]  Primary Care Physician:  [name], PA-C Primary Gastroenterologist:  Dr. Servando Snare  Pre-Procedure History & Physical: HPI:  Kevin Benton is a 31 y.o. male is here for an endoscopy and colonoscopy.   Past Medical History:  Diagnosis Date   Family history of adverse reaction to anesthesia    Mother - slow to wake   GERD (gastroesophageal reflux disease)    History of kidney stones    Kidney stone    Pneumonia     Past Surgical History:  Procedure Laterality Date   APPENDECTOMY     EXTRACORPOREAL SHOCK WAVE LITHOTRIPSY Right 07/29/2018   Procedure: EXTRACORPOREAL SHOCK WAVE LITHOTRIPSY (ESWL);  Surgeon: Sondra Come, MD;  Location: [facility] ORS;  Service: Urology;  Laterality: Right;    Prior to Admission medications   Medication Sig Start Date End Date Taking? Authorizing Provider  pantoprazole (PROTONIX) 40 MG tablet Take 1 tablet (40 mg total) by mouth daily. 02/11/22  Yes Drubel, Lillia Abed, PA-C  sucralfate (CARAFATE) 1 g tablet Take 1 tablet (1 g total) by mouth 4 (four) times daily -  with meals and at bedtime for 7 days. 03/20/22 04/21/22 Yes Drubel, Lillia Abed, PA-C    Allergies as of 03/26/2022 - Review Complete 03/26/2022  Allergen Reaction Noted   Diphenhydramine Hives 07/26/2018   Cyclobenzaprine Itching 07/28/2018    Family History  Problem Relation Age of Onset   Dementia Mother    Leukemia Maternal Grandmother    Lung cancer Maternal Grandfather    Leukemia Paternal Grandmother    Diabetes Paternal Grandfather     Social History   Socioeconomic History   Marital status: Single    Spouse name: Not on file   Number of children: Not on file   Years of education: Not on file   Highest education level: Not on file  Occupational History   Not on file  Tobacco Use   Smoking status: Never   Smokeless tobacco: Never  Vaping Use   Vaping Use:  Never used  Substance and Sexual Activity   Alcohol use: No    Comment: Rarely   Drug use: No   Sexual activity: Yes    Birth control/protection: None  Other Topics Concern   Not on file  Social History Narrative   Not on file   Social Determinants of Health   Financial Resource Strain: Not on file  Food Insecurity: Not on file  Transportation Needs: Not on file  Physical Activity: Not on file  Stress: Not on file  Social Connections: Not on file  Intimate Partner Violence: Not on file    Review of Systems: See HPI, otherwise negative ROS  Physical Exam: BP 117/83   Pulse 73   Temp 98.6 F (37 C) (Temporal)   Ht 5\' 8"  (1.727 m)   Wt 69.1 kg   SpO2 100%   BMI 23.16 kg/m  General:   Alert,  pleasant and cooperative in NAD Head:  Normocephalic and atraumatic. Neck:  Supple; no masses or thyromegaly. Lungs:  Clear throughout to auscultation.    Heart:  Regular rate and rhythm. Abdomen:  Soft, nontender and nondistended. Normal bowel sounds, without guarding, and without rebound.   Neurologic:  Alert and  oriented x4;  grossly normal neurologically.  Impression/Plan: Kevin Benton is here for an endoscopy and colonoscopy to be performed for epigastric pain and diarrhea  Risks, benefits, limitations,  and alternatives regarding  endoscopy and colonoscopy have been reviewed with the patient.  Questions have been answered.  All parties agreeable.   Midge Minium, MD  04/21/2022, 10:10 AM

## 2022-04-22 LAB — SURGICAL PATHOLOGY

## 2022-04-23 MED ORDER — ONDANSETRON HCL 4 MG PO TABS: 4.0000 mg | ORAL_TABLET | Freq: Four times a day (QID) | ORAL | 0 refills | Status: AC | PRN

## 2022-04-23 NOTE — Addendum Note (Signed)
Addended by: Roena Malady on: 04/23/2022 09:14 AM   Modules accepted: Orders

## 2022-06-02 ENCOUNTER — Other Ambulatory Visit: Payer: Self-pay

## 2022-06-02 ENCOUNTER — Encounter: Payer: Self-pay | Admitting: Gastroenterology

## 2022-06-02 MED ORDER — DICYCLOMINE HCL 20 MG PO TABS
20.0000 mg | ORAL_TABLET | Freq: Three times a day (TID) | ORAL | 0 refills | Status: AC
Start: 2022-06-02 — End: ?

## 2022-08-19 ENCOUNTER — Encounter: Payer: BC Managed Care – PPO | Admitting: Physician Assistant
